# Patient Record
Sex: Female | Born: 1960 | Race: White | Hispanic: No | Marital: Married | Smoking: Current every day smoker
Health system: Southern US, Community
[De-identification: ages and names within clinical notes are randomized; demographics above are authoritative.]

## PROBLEM LIST (undated history)

## (undated) DIAGNOSIS — G4730 Sleep apnea, unspecified: Secondary | ICD-10-CM

## (undated) DIAGNOSIS — K219 Gastro-esophageal reflux disease without esophagitis: Secondary | ICD-10-CM

## (undated) DIAGNOSIS — M722 Plantar fascial fibromatosis: Secondary | ICD-10-CM

## (undated) DIAGNOSIS — K648 Other hemorrhoids: Secondary | ICD-10-CM

## (undated) DIAGNOSIS — Z973 Presence of spectacles and contact lenses: Secondary | ICD-10-CM

## (undated) DIAGNOSIS — Z720 Tobacco use: Secondary | ICD-10-CM

## (undated) DIAGNOSIS — F419 Anxiety disorder, unspecified: Secondary | ICD-10-CM

## (undated) DIAGNOSIS — I700 Atherosclerosis of aorta: Secondary | ICD-10-CM

## (undated) DIAGNOSIS — R945 Abnormal results of liver function studies: Secondary | ICD-10-CM

## (undated) DIAGNOSIS — I10 Essential (primary) hypertension: Secondary | ICD-10-CM

## (undated) DIAGNOSIS — K760 Fatty (change of) liver, not elsewhere classified: Secondary | ICD-10-CM

## (undated) DIAGNOSIS — E7800 Pure hypercholesterolemia, unspecified: Secondary | ICD-10-CM

## (undated) DIAGNOSIS — D126 Benign neoplasm of colon, unspecified: Secondary | ICD-10-CM

## (undated) DIAGNOSIS — K5790 Diverticulosis of intestine, part unspecified, without perforation or abscess without bleeding: Secondary | ICD-10-CM

## (undated) DIAGNOSIS — R251 Tremor, unspecified: Secondary | ICD-10-CM

## (undated) DIAGNOSIS — Z8719 Personal history of other diseases of the digestive system: Secondary | ICD-10-CM

## (undated) DIAGNOSIS — R7989 Other specified abnormal findings of blood chemistry: Secondary | ICD-10-CM

## (undated) HISTORY — DX: Essential (primary) hypertension: I10

## (undated) HISTORY — DX: Tremor, unspecified: R25.1

## (undated) HISTORY — DX: Tobacco use: Z72.0

## (undated) HISTORY — DX: Abnormal results of liver function studies: R94.5

## (undated) HISTORY — DX: Benign neoplasm of colon, unspecified: D12.6

## (undated) HISTORY — DX: Pure hypercholesterolemia, unspecified: E78.00

## (undated) HISTORY — DX: Other hemorrhoids: K64.8

## (undated) HISTORY — DX: Fatty (change of) liver, not elsewhere classified: K76.0

## (undated) HISTORY — DX: Atherosclerosis of aorta: I70.0

## (undated) HISTORY — DX: Anxiety disorder, unspecified: F41.9

## (undated) HISTORY — DX: Diverticulosis of intestine, part unspecified, without perforation or abscess without bleeding: K57.90

## (undated) HISTORY — DX: Other specified abnormal findings of blood chemistry: R79.89

## (undated) HISTORY — DX: Plantar fascial fibromatosis: M72.2

---
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
## 1993-05-05 HISTORY — PX: TUBAL LIGATION: SHX77

## 1998-03-06 ENCOUNTER — Other Ambulatory Visit: Admission: RE | Admit: 1998-03-06 | Discharge: 1998-03-06 | Payer: Self-pay | Admitting: *Deleted

## 1998-03-16 ENCOUNTER — Other Ambulatory Visit: Admission: RE | Admit: 1998-03-16 | Discharge: 1998-03-16 | Payer: Self-pay | Admitting: *Deleted

## 2001-07-09 ENCOUNTER — Encounter: Payer: Self-pay | Admitting: Family Medicine

## 2001-07-09 ENCOUNTER — Encounter: Admission: RE | Admit: 2001-07-09 | Discharge: 2001-07-09 | Payer: Self-pay | Admitting: Family Medicine

## 2001-08-05 ENCOUNTER — Encounter: Payer: Self-pay | Admitting: Family Medicine

## 2001-08-05 LAB — CONVERTED CEMR LAB: TSH: 1.86 microintl units/mL

## 2001-08-19 ENCOUNTER — Other Ambulatory Visit: Admission: RE | Admit: 2001-08-19 | Discharge: 2001-08-19 | Payer: Self-pay | Admitting: Family Medicine

## 2001-08-19 ENCOUNTER — Encounter: Payer: Self-pay | Admitting: Family Medicine

## 2001-08-19 LAB — CONVERTED CEMR LAB: Pap Smear: NORMAL

## 2002-11-01 ENCOUNTER — Encounter: Payer: Self-pay | Admitting: Family Medicine

## 2002-11-01 LAB — CONVERTED CEMR LAB
Blood Glucose, Fasting: 94 mg/dL
TSH: 1.59 microintl units/mL

## 2002-11-11 ENCOUNTER — Encounter: Payer: Self-pay | Admitting: Family Medicine

## 2002-11-11 ENCOUNTER — Other Ambulatory Visit: Admission: RE | Admit: 2002-11-11 | Discharge: 2002-11-11 | Payer: Self-pay | Admitting: Family Medicine

## 2002-11-11 LAB — CONVERTED CEMR LAB: Pap Smear: NORMAL

## 2002-11-15 ENCOUNTER — Encounter: Payer: Self-pay | Admitting: Family Medicine

## 2002-11-15 ENCOUNTER — Ambulatory Visit (HOSPITAL_COMMUNITY): Admission: RE | Admit: 2002-11-15 | Discharge: 2002-11-15 | Payer: Self-pay | Admitting: Family Medicine

## 2004-03-21 ENCOUNTER — Ambulatory Visit: Payer: Self-pay | Admitting: Family Medicine

## 2004-03-21 LAB — CONVERTED CEMR LAB: TSH: 2.39 microintl units/mL

## 2004-05-27 ENCOUNTER — Ambulatory Visit: Payer: Self-pay | Admitting: Family Medicine

## 2004-05-27 LAB — CONVERTED CEMR LAB: Blood Glucose, Fasting: 94 mg/dL

## 2004-05-31 ENCOUNTER — Ambulatory Visit: Payer: Self-pay | Admitting: Family Medicine

## 2004-05-31 ENCOUNTER — Other Ambulatory Visit: Admission: RE | Admit: 2004-05-31 | Discharge: 2004-05-31 | Payer: Self-pay | Admitting: Family Medicine

## 2004-06-06 ENCOUNTER — Ambulatory Visit (HOSPITAL_COMMUNITY): Admission: RE | Admit: 2004-06-06 | Discharge: 2004-06-06 | Payer: Self-pay | Admitting: Family Medicine

## 2004-07-23 ENCOUNTER — Ambulatory Visit: Payer: Self-pay | Admitting: Internal Medicine

## 2005-03-25 ENCOUNTER — Ambulatory Visit: Payer: Self-pay | Admitting: Family Medicine

## 2005-04-14 ENCOUNTER — Ambulatory Visit: Payer: Self-pay | Admitting: Family Medicine

## 2005-05-14 ENCOUNTER — Ambulatory Visit: Payer: Self-pay | Admitting: Family Medicine

## 2005-06-02 ENCOUNTER — Ambulatory Visit: Payer: Self-pay | Admitting: Family Medicine

## 2005-06-02 LAB — CONVERTED CEMR LAB: WBC, blood: 13.3 10*3/uL

## 2005-06-06 ENCOUNTER — Encounter: Payer: Self-pay | Admitting: Family Medicine

## 2005-06-06 ENCOUNTER — Ambulatory Visit: Payer: Self-pay | Admitting: Family Medicine

## 2005-06-06 ENCOUNTER — Other Ambulatory Visit: Admission: RE | Admit: 2005-06-06 | Discharge: 2005-06-06 | Payer: Self-pay | Admitting: Family Medicine

## 2006-11-04 ENCOUNTER — Ambulatory Visit: Payer: Self-pay | Admitting: Family Medicine

## 2007-03-08 ENCOUNTER — Ambulatory Visit: Payer: Self-pay | Admitting: Family Medicine

## 2007-03-08 DIAGNOSIS — F411 Generalized anxiety disorder: Secondary | ICD-10-CM | POA: Insufficient documentation

## 2007-03-08 DIAGNOSIS — I10 Essential (primary) hypertension: Secondary | ICD-10-CM | POA: Insufficient documentation

## 2007-06-14 ENCOUNTER — Telehealth: Payer: Self-pay | Admitting: Family Medicine

## 2007-06-18 ENCOUNTER — Encounter: Payer: Self-pay | Admitting: Family Medicine

## 2007-06-18 DIAGNOSIS — E7800 Pure hypercholesterolemia, unspecified: Secondary | ICD-10-CM | POA: Insufficient documentation

## 2007-06-21 ENCOUNTER — Ambulatory Visit: Payer: Self-pay | Admitting: Family Medicine

## 2007-06-21 LAB — CONVERTED CEMR LAB
Albumin: 3.7 g/dL (ref 3.5–5.2)
GFR calc non Af Amer: 83 mL/min
HDL: 43.7 mg/dL (ref >39.0)
LDL Cholesterol: 103 mg/dL — ABNORMAL HIGH (ref 0–99)
Potassium: 4.1 meq/L (ref 3.5–5.1)
Sodium: 142 meq/L (ref 135–145)
TSH: 1.84 microintl units/mL (ref 0.35–5.50)
Total CHOL/HDL Ratio: 3.7
Triglycerides: 66 mg/dL (ref 0–149)
VLDL: 13 mg/dL (ref 0–40)

## 2007-06-24 ENCOUNTER — Other Ambulatory Visit: Admission: RE | Admit: 2007-06-24 | Discharge: 2007-06-24 | Payer: Self-pay | Admitting: Family Medicine

## 2007-06-24 ENCOUNTER — Ambulatory Visit: Payer: Self-pay | Admitting: Family Medicine

## 2007-06-24 ENCOUNTER — Encounter: Payer: Self-pay | Admitting: Family Medicine

## 2007-06-24 DIAGNOSIS — M722 Plantar fascial fibromatosis: Secondary | ICD-10-CM | POA: Insufficient documentation

## 2007-06-30 ENCOUNTER — Encounter (INDEPENDENT_AMBULATORY_CARE_PROVIDER_SITE_OTHER): Payer: Self-pay | Admitting: *Deleted

## 2007-07-16 ENCOUNTER — Ambulatory Visit (HOSPITAL_COMMUNITY): Admission: RE | Admit: 2007-07-16 | Discharge: 2007-07-16 | Payer: Self-pay | Admitting: Family Medicine

## 2007-07-26 ENCOUNTER — Encounter (INDEPENDENT_AMBULATORY_CARE_PROVIDER_SITE_OTHER): Payer: Self-pay | Admitting: *Deleted

## 2007-09-21 ENCOUNTER — Ambulatory Visit: Payer: Self-pay | Admitting: Family Medicine

## 2007-10-04 ENCOUNTER — Telehealth: Payer: Self-pay | Admitting: Family Medicine

## 2008-06-20 ENCOUNTER — Telehealth: Payer: Self-pay | Admitting: Family Medicine

## 2008-09-11 ENCOUNTER — Ambulatory Visit: Payer: Self-pay | Admitting: Family Medicine

## 2008-09-11 LAB — CONVERTED CEMR LAB
AST: 22 units/L (ref 0–37)
BUN: 13 mg/dL (ref 6–23)
Basophils Absolute: 0.1 10*3/uL (ref 0.0–0.1)
CO2: 30 meq/L (ref 19–32)
Calcium: 8.7 mg/dL (ref 8.4–10.5)
Cholesterol: 175 mg/dL (ref 0–200)
Eosinophils Absolute: 0.1 10*3/uL (ref 0.0–0.7)
GFR calc non Af Amer: 82.07 mL/min (ref >60)
Glucose, Bld: 99 mg/dL (ref 70–99)
HCT: 41.7 % (ref 36.0–46.0)
HDL: 46.1 mg/dL (ref >39.00)
Lymphs Abs: 2.9 10*3/uL (ref 0.7–4.0)
MCHC: 34.8 g/dL (ref 30.0–36.0)
Monocytes Relative: 5.3 % (ref 3.0–12.0)
Platelets: 205 10*3/uL (ref 150.0–400.0)
Potassium: 4.4 meq/L (ref 3.5–5.1)
RDW: 13.4 % (ref 11.5–14.6)
TSH: 1.77 microintl units/mL (ref 0.35–5.50)
Total Bilirubin: 0.5 mg/dL (ref 0.3–1.2)
VLDL: 14.2 mg/dL (ref 0.0–40.0)

## 2008-09-13 ENCOUNTER — Encounter: Payer: Self-pay | Admitting: Family Medicine

## 2008-09-13 ENCOUNTER — Other Ambulatory Visit: Admission: RE | Admit: 2008-09-13 | Discharge: 2008-09-13 | Payer: Self-pay | Admitting: Family Medicine

## 2008-09-13 ENCOUNTER — Ambulatory Visit: Payer: Self-pay | Admitting: Family Medicine

## 2008-09-13 LAB — CONVERTED CEMR LAB: Pap Smear: NORMAL

## 2008-09-18 ENCOUNTER — Encounter (INDEPENDENT_AMBULATORY_CARE_PROVIDER_SITE_OTHER): Payer: Self-pay | Admitting: *Deleted

## 2008-11-14 ENCOUNTER — Ambulatory Visit: Payer: Self-pay | Admitting: Family Medicine

## 2008-11-14 LAB — CONVERTED CEMR LAB
Basophils Relative: 0.2 % (ref 0.0–3.0)
Eosinophils Relative: 1.1 % (ref 0.0–5.0)
HCT: 37.9 % (ref 36.0–46.0)
Hemoglobin: 13.2 g/dL (ref 12.0–15.0)
Lymphocytes Relative: 28 % (ref 12.0–46.0)
Lymphs Abs: 2.9 10*3/uL (ref 0.7–4.0)
Monocytes Relative: 5.7 % (ref 3.0–12.0)
Neutro Abs: 6.7 10*3/uL (ref 1.4–7.7)
RBC: 4.4 M/uL (ref 3.87–5.11)
RDW: 13.4 % (ref 11.5–14.6)
WBC: 10.3 10*3/uL (ref 4.5–10.5)

## 2009-06-21 ENCOUNTER — Telehealth: Payer: Self-pay | Admitting: Family Medicine

## 2009-12-10 ENCOUNTER — Encounter (INDEPENDENT_AMBULATORY_CARE_PROVIDER_SITE_OTHER): Payer: Self-pay | Admitting: *Deleted

## 2010-01-11 ENCOUNTER — Telehealth (INDEPENDENT_AMBULATORY_CARE_PROVIDER_SITE_OTHER): Payer: Self-pay | Admitting: *Deleted

## 2010-01-14 ENCOUNTER — Ambulatory Visit: Payer: Self-pay | Admitting: Family Medicine

## 2010-01-14 LAB — CONVERTED CEMR LAB
ALT: 25 units/L (ref 0–35)
Albumin: 3.8 g/dL (ref 3.5–5.2)
BUN: 14 mg/dL (ref 6–23)
CO2: 28 meq/L (ref 19–32)
Calcium: 8.7 mg/dL (ref 8.4–10.5)
Chloride: 103 meq/L (ref 96–112)
Creatinine, Ser: 0.9 mg/dL (ref 0.4–1.2)
Glucose, Bld: 94 mg/dL (ref 70–99)
HDL: 45.1 mg/dL (ref >39.00)
Total CHOL/HDL Ratio: 4
Total Protein: 6.2 g/dL (ref 6.0–8.3)
Triglycerides: 68 mg/dL (ref 0.0–149.0)

## 2010-01-15 ENCOUNTER — Ambulatory Visit: Payer: Self-pay | Admitting: Family Medicine

## 2010-01-15 ENCOUNTER — Other Ambulatory Visit: Admission: RE | Admit: 2010-01-15 | Discharge: 2010-01-15 | Payer: Self-pay | Admitting: Family Medicine

## 2010-01-22 ENCOUNTER — Encounter: Payer: Self-pay | Admitting: Family Medicine

## 2010-01-22 LAB — CONVERTED CEMR LAB: Pap Smear: NEGATIVE

## 2010-05-25 ENCOUNTER — Encounter: Payer: Self-pay | Admitting: Family Medicine

## 2010-05-26 ENCOUNTER — Encounter: Payer: Self-pay | Admitting: Family Medicine

## 2010-06-04 NOTE — Progress Notes (Signed)
----   Converted from flag ---- ---- 01/10/2010 5:44 PM, Crawford Givens MD wrote: cmet and lipid 401.1  ---- 01/10/2010 10:56 AM, Liane Comber CMA (AAMA) wrote: Lab orders please! Hello! This pt is scheduled for cpx labs next week, which labs to draw and dx codes to use? Thanks Tasha ------------------------------

## 2010-06-04 NOTE — Letter (Signed)
Summary: Results Follow up Letter  [place] [facility]  [address] [place], [place] [address]   Phone: [phone]  Fax: [phone]    01/22/2010 MRN: [id number]    JAZIYA OBARR [address] [place], [place]  [address]    Dear Ms. FERRALL,  The following are the results of your recent test(s):  Test         Result    Pap Smear:        Normal ___X__  Not Normal _____ Comments:   ______________________________________________________ Cholesterol: LDL(Bad cholesterol):         Your goal is less than:         HDL (Good cholesterol):       Your goal is more than: Comments:  ______________________________________________________ Mammogram:        Normal _____  Not Normal _____ Comments:  ___________________________________________________________________ Hemoccult:        Normal _____  Not normal _______ Comments:    _____________________________________________________________________ Other Tests:    We routinely do not discuss normal results over the telephone.  If you desire a copy of the results, or you have any questions about this information we can discuss them at your next office visit.   Sincerely,    Dwana Curd. Para March, M.D.  [facility]

## 2010-06-04 NOTE — Assessment & Plan Note (Signed)
Summary: cpx/alc transfer from [facility]   Vital Signs:  Patient profile:   50 year old female LMP:     12/25/2009 Height:      67 inches Weight:      206.25 pounds BMI:     32.42 Temp:     98.7 degrees F oral Pulse rate:   88 / minute Pulse rhythm:   regular BP sitting:   132 / 70  (left arm) Cuff size:   regular  Vitals Entered By: Delilah Shan CMA [facility] Dull) (January 15, 2010 11:14 AM) CC: CPX.   Transfer from [facility], Preventive Care LMP (date): 12/25/2009     Enter LMP: 12/25/2009 Last PAP Result Normal, Satisfactory   History of Present Illness: CPE: See prev med.   Anxiety- controlled on meds.  Some fatigue noted but overall patient is much better than before.  No SI/HI.   Hypertension:      Using medication without problems or lightheadedness: yes Chest pain with exertion:no Edema:no Short of breath:no Average home BPs:not checked Other issues: no   Allergies: No Known Drug Allergies  Past History:  Family History: Last updated: 10-12-08 Father:DECEASED AT 66 MI  Mother: DECEASED AT 81 MI,CAD, DM BROTHER A 54 DIABETES AND RENAL CANCER (S/P Nephrectomy) BROTHER A 52 BROTHERS TWINS A 37 CV: +FATHER; +MOTHER HBP: +SELF  DM: +MOTHER, +BROTHER GOUT/ARTHRITIS: PROSTATE CANCER: BREAST/OVARIAN/UTERINE CANCER:" NEGATIVE COLON CANCER: DEPRESSION: NEGATIVE ETOH/DRUG ABUSE: NEGATIVE OTHER: NEGATIVE STROKE  Social History: Last updated: 01/15/2010 Marital Status: Married since 38 LIVES WITH HUSBAND Children: NONE 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 BTL AFTER PREGNANCY WITH BLIGHTED OVUM  1995  Family History: Reviewed history from 10/12/2008 and no changes required. Father:DECEASED AT 30 MI  Mother: DECEASED AT 10  MI,CAD, DM BROTHER A 54 DIABETES AND RENAL CANCER (S/P Nephrectomy) BROTHER A 52 BROTHERS TWINS A 37 CV: +FATHER; +MOTHER HBP: +SELF  DM: +MOTHER, +BROTHER GOUT/ARTHRITIS: PROSTATE CANCER: BREAST/OVARIAN/UTERINE CANCER:" NEGATIVE COLON CANCER: DEPRESSION: NEGATIVE ETOH/DRUG ABUSE: NEGATIVE OTHER: NEGATIVE STROKE  Social History: Reviewed history from 10-12-08 and no changes required. 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  Otherwise negative.    Physical Exam  General:  GEN: nad, alert and oriented HEENT: mucous membranes moist NECK: supple w/o LA CV: rrr.  no murmur PULM: ctab, no inc wob ABD: soft, +bs EXT: no edema SKIN: no acute rash  chaperoned exam  for breast/pelvic Breasts:  No mass, nodules, thickening, tenderness, bulging, retraction, inflamation, nipple discharge or skin changes noted.   Genitalia:  Normal introitus for age, no external lesions, no vaginal discharge, mucosa pink and moist, no vaginal or cervical lesions, no vaginal atrophy, no friaility or hemorrhage, normal uterus size and position, no adnexal masses or tenderness   Impression & Recommendations:  Problem # 1:  HEALTH MAINTENANCE EXAM (ICD-V70.0) Pap collected.  See prev med.   Problem # 2:  ESSENTIAL HYPERTENSION, BENIGN (ICD-401.1) No change in meds.  d/w patient [id number] diet and exericse.  Needs to lose weight.  Her updated medication list for this problem includes:    Metoprolol Tartrate 50 Mg Tabs (  Metoprolol tartrate) .Marland Kitchen... 1/2 tablet twice a day by mouth  Problem # 3:  ANXIETY (ICD-300.00) No change in meds.  D/w patient to cut down on alcohol.  Her updated medication list for this problem includes:    Sertraline Hcl 100 Mg Tabs (Sertraline hcl) .Marland Kitchen... 1/2 tab once daily  Problem # 4:   NICOTINE ADDICTION (ICD-305.1) d/w patient ZO:XWRUEAV, not yet ready to quit.   Complete Medication List: 1)  Sertraline Hcl 100 Mg Tabs (Sertraline hcl) .... 1/2 tab once daily 2)  Metoprolol Tartrate 50 Mg Tabs (Metoprolol tartrate) .... 1/2 tablet twice a day by mouth  Other Orders: Radiology Referral (Radiology)  PAP Screening:    Last PAP smear:  09/13/2008    Reviewed PAP smear recommendations:  Ordered  Mammogram Screening:    Last Mammogram:  07/16/2007    Reviewed Mammogram recommendations:  mammogram ordered  Osteoporosis Risk Assessment:  Risk Factors for Fracture or Low Bone Density:   Race (White or Asian):     yes   Smoking status:       current  Immunization & Chemoprophylaxis:    Tetanus vaccine: Td  (08/03/2001)  Patient Instructions: 1)  See Shirlee Limerick about your referral before your leave today.  You can get your results of your pap smear through our phone system.  Follow the instructions on the blue card.  2)  Work on quitting smoking and exercising more.    3)  Please follow up as needed  Prescriptions: METOPROLOL TARTRATE 50 MG TABS (METOPROLOL TARTRATE) 1/2 tablet twice a day by mouth  #45 x 3   Entered and Authorized by:   Crawford Givens MD   Signed by:   Crawford Givens MD on 01/15/2010   Method used:   [facility]        [facility]  [address] [phone]* (retail)       [address]       [place], [place]  [address]       Ph: [phone] or [phone]       Fax: [phone]   RxID:   [id number] SERTRALINE HCL 100 MG TABS (SERTRALINE HCL) 1/2 tab once daily  #45 x 3   Entered and Authorized by:   Crawford Givens MD   Signed by:   Crawford Givens MD on 01/15/2010   Method used:   [facility]        [facility]  [address] [phone]* (retail)       [address]       [place], [place]  [address]       Ph: [phone] or [phone]       Fax: [phone]   RxID:   [id number]   Current Allergies (reviewed  today): No known allergies      Prevention & Chronic Care Immunizations   Influenza vaccine: Not documented    Tetanus booster: 08/03/2001: Td    Pneumococcal vaccine: Not documented   Pneumococcal vaccine due: 09/04/2027  Other Screening   Pap smear: Normal, Satisfactory  (09/13/2008)   Pap smear action/deferral: Ordered  (01/15/2010)   Pap smear due: 09/2009    Mammogram: Normal Bilateral  (07/16/2007)   Mammogram action/deferral: mammogram ordered  (01/15/2010)   Mammogram due: 07/2008   Smoking status: current  (09/13/2008)   Smoking cessation counseling: yes  (09/13/2008)  Lipids   Total Cholesterol: 181  (01/14/2010)   LDL:  122  (01/14/2010)   LDL Direct: Not documented   HDL: 45.10  (01/14/2010)   Triglycerides: 68.0  (01/14/2010)    SGOT (AST): 22  (01/14/2010)   SGPT (ALT): 25  (01/14/2010)   Alkaline phosphatase: 86  (01/14/2010)   Total bilirubin: 0.7  (01/14/2010)    Lipid flowsheet reviewed?: Yes  Hypertension   Last Blood Pressure: 132 / 70  (01/15/2010)   Serum creatinine: 0.9  (01/14/2010)   Serum potassium 4.4  (01/14/2010)    Hypertension flowsheet reviewed?: Yes  Self-Management Support :    Hypertension self-management support: Not documented    Lipid self-management support: Not documented    Nursing Instructions: Give Flu vaccine today Pap smear today Schedule screening mammogram (see order)

## 2010-06-04 NOTE — Progress Notes (Signed)
Summary: Rx Sertraline  Phone Note Refill Request Call back at [phone] Message [facility]:  [facility] [address] on June 21, 2009 4:55 PM  Refills Requested: Medication #1:  SERTRALINE HCL 100 MG TABS 1/2 tab once daily No LR date sent. Received e-scribe refill request   Method Requested: Electronic Initial call taken by: Sydell Axon LPN,  June 21, 2009 4:55 PM    Prescriptions: SERTRALINE HCL 100 MG TABS (SERTRALINE HCL) 1/2 tab once daily  #15 Tablet x 10   Entered and Authorized by:   Shaune Leeks MD   Signed by:   Shaune Leeks MD on 06/21/2009   Method used:   [facility]        [facility]  [address] [phone][facility] ([facility]ail)       [address]       [place], [place]  [address]       Ph: [phone] or [phone]       Fax: [phone]   RxID:   [id number]

## 2010-06-04 NOTE — Letter (Signed)
Summary: [name] letter  [place] at [facility]  [address] [place], [place] [address]   Phone: [phone]  Fax: [phone]       12/10/2009 MRN: [id number]  Nichole Burke [address] [place], [place]  [address]  Dear Ms. [name] [facility] [place], and [facility] announce the retirement of Arta Silence, M.D., from full-time practice at the [facility] office effective November 01, 2009 and his plans of returning part-time.  It is important to Dr. Hetty Ely and to our practice that you understand that [facility] has seven physicians in our office for your health care needs.  We will continue to offer the same exceptional care that you have today.    Dr. Hetty Ely has spoken to many of you about his plans for retirement and returning part-time in the fall.   We will continue to work with you through the transition to schedule appointments for you in the office and meet the high standards that  is committed to.   Again, it is with great pleasure that we share the news that Dr. Hetty Ely will return to [facility] at [facility] in October of 2011 with a reduced schedule.    If you have any questions, or would like to request an appointment with one of our physicians, please call us at [phone] and press the option for Scheduling an appointment.  We take pleasure in providing you with excellent patient care and look forward to seeing you at your next office visit.  Our [facility] Physicians are:  Tillman Abide, M.D. Laurita Quint, M.D. Roxy Manns, M.D. Kerby Nora, M.D. Hannah Beat, M.D. Ruthe Mannan, M.D. We proudly welcomed Raechel Ache, M.D. and Eustaquio Boyden, M.D. to the practice in July/August 2011.  Sincerely,   [facility]

## 2010-10-09 ENCOUNTER — Encounter: Payer: Self-pay | Admitting: Family Medicine

## 2010-10-10 ENCOUNTER — Encounter: Payer: Self-pay | Admitting: Family Medicine

## 2010-10-10 ENCOUNTER — Ambulatory Visit (INDEPENDENT_AMBULATORY_CARE_PROVIDER_SITE_OTHER): Payer: PRIVATE HEALTH INSURANCE | Admitting: Family Medicine

## 2010-10-10 DIAGNOSIS — F17200 Nicotine dependence, unspecified, uncomplicated: Secondary | ICD-10-CM

## 2010-10-10 DIAGNOSIS — K7689 Other specified diseases of liver: Secondary | ICD-10-CM

## 2010-10-10 DIAGNOSIS — K760 Fatty (change of) liver, not elsewhere classified: Secondary | ICD-10-CM

## 2010-10-10 DIAGNOSIS — R079 Chest pain, unspecified: Secondary | ICD-10-CM

## 2010-10-10 DIAGNOSIS — I10 Essential (primary) hypertension: Secondary | ICD-10-CM

## 2010-10-10 DIAGNOSIS — M25569 Pain in unspecified knee: Secondary | ICD-10-CM

## 2010-10-10 DIAGNOSIS — I779 Disorder of arteries and arterioles, unspecified: Secondary | ICD-10-CM

## 2010-10-10 NOTE — Patient Instructions (Signed)
See Shirlee Limerick about your referral before your leave today. I would get flavored nicotine gum and use it to replace the cigarettes. Cut down on drinking. I would take 1 aleve twice a day as needed with food for knee pain.  Work on losing weight.   Take care.

## 2010-10-10 NOTE — Progress Notes (Signed)
Hospital follow up.  Woke up Sunday night with atypical chest and back pain.  Pain radiated up into her neck.  She took an aspirin, but it didn't help.  EMS to ER and admitted for r/o MI.  No PE on CT angio, echo wnl.  Mild inc in LFTs, fatty liver noted on CT scan.  Back pain continued in hospital and she had trouble laying back to the pain.  Pain has been gone since Tuesday.  CP free now, not sob.  Smoking ~1ppd, drinking about a 5th a week.  Mild dilation of ascending aorta noted on CT chest.  FH of cad.   PMH and SH reviewed  ROS: See HPI, otherwise noncontributory.  Meds, vitals, and allergies reviewed.   GEN: nad, alert and oriented, overweight HEENT: mucous membranes moist NECK: supple w/o LA CV: rrr. PULM: ctab, no inc wob ABD: soft, +bs EXT: no edema SKIN: no acute rash, lipoma noted on R upper back

## 2010-10-11 ENCOUNTER — Encounter: Payer: Self-pay | Admitting: Family Medicine

## 2010-10-11 DIAGNOSIS — R079 Chest pain, unspecified: Secondary | ICD-10-CM | POA: Insufficient documentation

## 2010-10-11 NOTE — Assessment & Plan Note (Signed)
Atypical but with +FH, HTN, smoker.  Refer to cards for consideration of stress testing.  App cards input.  This could have been due to anxiety, MSK source.  No PE seen on angio.

## 2010-10-11 NOTE — Assessment & Plan Note (Signed)
No change in meds for now

## 2010-10-11 NOTE — Assessment & Plan Note (Signed)
D/w pt. Needs to lose weight and cut down etoh.

## 2010-10-11 NOTE — Assessment & Plan Note (Signed)
D/w pt about getting replacement, gum and using this.

## 2010-10-11 NOTE — Assessment & Plan Note (Signed)
Will refer to cards about this and the atypical chest pain.

## 2010-10-28 ENCOUNTER — Ambulatory Visit (INDEPENDENT_AMBULATORY_CARE_PROVIDER_SITE_OTHER): Payer: PRIVATE HEALTH INSURANCE | Admitting: Cardiology

## 2010-10-28 ENCOUNTER — Encounter: Payer: Self-pay | Admitting: Cardiology

## 2010-10-28 DIAGNOSIS — R079 Chest pain, unspecified: Secondary | ICD-10-CM

## 2010-10-28 DIAGNOSIS — F17200 Nicotine dependence, unspecified, uncomplicated: Secondary | ICD-10-CM

## 2010-10-28 DIAGNOSIS — I779 Disorder of arteries and arterioles, unspecified: Secondary | ICD-10-CM

## 2010-10-28 DIAGNOSIS — I10 Essential (primary) hypertension: Secondary | ICD-10-CM

## 2010-10-28 DIAGNOSIS — I77810 Thoracic aortic ectasia: Secondary | ICD-10-CM

## 2010-10-28 MED ORDER — METOPROLOL SUCCINATE ER 25 MG PO TB24: 25.0000 mg | ORAL_TABLET | Freq: Every day | ORAL | Status: DC

## 2010-10-28 NOTE — Assessment & Plan Note (Signed)
Mildly dilated ascending thoracic aorta to 3.7 cm.  Probably most effective way to prevent progression will be to keep BP under control.  I will get an MR angiogram in 1 year to assess for any progressive dilatation.

## 2010-10-28 NOTE — Assessment & Plan Note (Signed)
She is currently taking short-acting metoprolol once a day. A beta blocker is a reasonable choice given her aortic dilatation.  I will change her beta blocker to Toprol XL 25 mg daily.  She will check her BP daily at home and I will see what her pressures are running when she comes back to the office in 2 weeks.

## 2010-10-28 NOTE — Patient Instructions (Signed)
Your physician recommends that you schedule a follow-up appointment in: 3 WEEKS WITH DR [facility]  Your physician has recommended you make the following change in your medication: STOP METOPROLOL AND START TOPROL XL 25 MG EVERY DAY   ASPIRIN 81 MG EVERY DAY Your physician has requested that you have en exercise stress myoview. For further information please visit [email or web address] Please follow instruction sheet, as given.  MRA OF CHEST  June 2013

## 2010-10-28 NOTE — Progress Notes (Signed)
PCP: Dr. Para March  50 yo with history of HTN and smoking presents for cardiology evaluation after recent chest pain admission at [facility].  Patient went to the ER in [facility] in 6/12 with atypical chest pain.  She woke up at night with chest/neck/upper back pain that was severe.  It did not subside after about an hour so she went to the ER.  She ruled out for MI.  She had a CT chest that did not show a PE but did show a 3.7 cm dilated ascending thoracic aorta.  She was told that the pain was likely musculoskeletal, and she was sent home. Since then, she has had no further chest pain. No history of exertional chest pain.  She is short of breath climbing steps or walking up hills.  She does have a family history of CAD: father with MI at 23 and mother with MI at 12.  She is currently taking metoprolol tartrate 25 mg once daily for BP.   ECG: NSR, normal  Labs (9/11): LDL 122  PMH: 1. Active smoker 2. HTN 3. Fatty liver with mildly increased LFTs.  Suspect may be from ETOH.  4. Dilation ascending aorta: 3.7 cm on chest CT in 6/12.  5. Atypical chest pain.  6. 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   FH: Father with MI at 15, Mother with MI at 63  ROS: All systems reviewed and negative except as per HPI.   Current Outpatient Prescriptions  Medication Sig Dispense Refill  . aspirin 81 MG tablet Take 81 mg by mouth daily.        . naproxen sodium (ANAPROX) 220 MG tablet Take 220 mg by mouth 2 (two) times daily with a meal.        . sertraline (ZOLOFT) 100 MG tablet Take 50 mg by mouth as needed.       Marland Kitchen DISCONTD: metoprolol (LOPRESSOR) 50 MG tablet Take 25 mg by mouth daily.       . metoprolol succinate (TOPROL XL) 25 MG 24 hr tablet Take 1 tablet (25 mg total) by mouth daily.  30 tablet  11  . DISCONTD: traMADol (ULTRAM) 50 MG tablet Take 25 mg by mouth every 4 (four) hours as needed.          BP  143/92  Pulse 72  Resp 16  Ht 5\' 6"  (1.676 m)  Wt 214 lb (97.07 kg)  BMI 34.54 kg/m2  LMP 09/19/2010 General: NAD, overweight Neck: No JVD, no thyromegaly or thyroid nodule.  Lungs: Clear to auscultation bilaterally with normal respiratory effort. CV: Nondisplaced PMI.  Heart regular S1/S2, no S3/S4, no murmur.  No peripheral edema.  No carotid bruit.  Normal pedal pulses.  Abdomen: Soft, nontender, no hepatosplenomegaly, no distention.  Neurologic: Alert and oriented x 3.  Psych: Normal affect. Extremities: No clubbing or cyanosis.  HEENT: Normal. Skin: no lesions/rashes.

## 2010-10-28 NOTE — Assessment & Plan Note (Signed)
Atypical chest pain with overnight admission recently to [facility].  She ruled out for MI.  CTA chest negative for PE.  She has multiple cardiac risk factors: family history of premature CAD, smoking, and HTN.  I will have her start ASA 81 mg daily. I will get an ETT-myoview to assess for ischemia.

## 2010-10-28 NOTE — Assessment & Plan Note (Signed)
Patient continues to smoke.  I counselled her to quit.  She will try nicotine patches.

## 2010-11-01 ENCOUNTER — Other Ambulatory Visit: Payer: Self-pay | Admitting: *Deleted

## 2010-11-01 DIAGNOSIS — R079 Chest pain, unspecified: Secondary | ICD-10-CM

## 2010-11-04 ENCOUNTER — Encounter: Payer: Self-pay | Admitting: *Deleted

## 2010-11-05 ENCOUNTER — Ambulatory Visit (HOSPITAL_BASED_OUTPATIENT_CLINIC_OR_DEPARTMENT_OTHER): Payer: Managed Care, Other (non HMO) | Admitting: Radiology

## 2010-11-05 ENCOUNTER — Ambulatory Visit (HOSPITAL_COMMUNITY): Payer: Managed Care, Other (non HMO) | Attending: Cardiology | Admitting: Radiology

## 2010-11-05 ENCOUNTER — Encounter (HOSPITAL_COMMUNITY): Payer: PRIVATE HEALTH INSURANCE | Admitting: Radiology

## 2010-11-05 DIAGNOSIS — I10 Essential (primary) hypertension: Secondary | ICD-10-CM | POA: Insufficient documentation

## 2010-11-05 DIAGNOSIS — R072 Precordial pain: Secondary | ICD-10-CM

## 2010-11-05 DIAGNOSIS — R079 Chest pain, unspecified: Secondary | ICD-10-CM

## 2010-11-05 DIAGNOSIS — R0989 Other specified symptoms and signs involving the circulatory and respiratory systems: Secondary | ICD-10-CM

## 2010-11-05 DIAGNOSIS — E785 Hyperlipidemia, unspecified: Secondary | ICD-10-CM | POA: Insufficient documentation

## 2010-11-05 DIAGNOSIS — R0609 Other forms of dyspnea: Secondary | ICD-10-CM | POA: Insufficient documentation

## 2010-11-08 NOTE — Progress Notes (Signed)
Left message for pt that she is to follow up in 1 year after her angiogram and will be contacted at a later date to schedule.  Requested she call back with any questions

## 2010-11-28 ENCOUNTER — Ambulatory Visit: Payer: PRIVATE HEALTH INSURANCE | Admitting: Cardiology

## 2011-02-02 ENCOUNTER — Other Ambulatory Visit: Payer: Self-pay | Admitting: Family Medicine

## 2011-08-06 ENCOUNTER — Other Ambulatory Visit: Payer: Self-pay | Admitting: Family Medicine

## 2011-08-06 NOTE — Telephone Encounter (Signed)
Received refill electronically from pharmacy. Patient was last seen 06/12. Is it okay to refill medication?

## 2011-08-07 NOTE — Telephone Encounter (Signed)
Needs 30 min OV.  

## 2011-08-07 NOTE — Telephone Encounter (Signed)
LMOVM of home phone to scheduled 30 min OV.

## 2011-10-14 ENCOUNTER — Other Ambulatory Visit: Payer: Self-pay | Admitting: Cardiology

## 2011-10-14 ENCOUNTER — Other Ambulatory Visit: Payer: Self-pay | Admitting: *Deleted

## 2011-10-14 ENCOUNTER — Telehealth: Payer: Self-pay | Admitting: Family Medicine

## 2011-10-14 MED ORDER — SERTRALINE HCL 100 MG PO TABS: 50.0000 mg | ORAL_TABLET | Freq: Every day | ORAL | Status: DC

## 2011-10-14 MED ORDER — METOPROLOL SUCCINATE ER 25 MG PO TB24: 25.0000 mg | ORAL_TABLET | Freq: Every day | ORAL | Status: DC

## 2011-10-14 NOTE — Telephone Encounter (Signed)
Pt is needing refilled on Zoloft [facility] [place] I set her up a CPE is August already.

## 2011-10-14 NOTE — Telephone Encounter (Signed)
Sent!

## 2011-10-28 ENCOUNTER — Other Ambulatory Visit: Payer: Self-pay | Admitting: *Deleted

## 2011-10-28 DIAGNOSIS — I779 Disorder of arteries and arterioles, unspecified: Secondary | ICD-10-CM

## 2011-10-29 ENCOUNTER — Other Ambulatory Visit: Payer: Self-pay | Admitting: *Deleted

## 2011-10-29 DIAGNOSIS — I779 Disorder of arteries and arterioles, unspecified: Secondary | ICD-10-CM

## 2011-10-30 ENCOUNTER — Inpatient Hospital Stay (HOSPITAL_COMMUNITY): Admission: RE | Admit: 2011-10-30 | Payer: PRIVATE HEALTH INSURANCE | Source: Ambulatory Visit

## 2011-11-12 ENCOUNTER — Other Ambulatory Visit: Payer: Self-pay | Admitting: Family Medicine

## 2011-11-12 NOTE — Telephone Encounter (Signed)
Sent!

## 2011-11-12 NOTE — Telephone Encounter (Signed)
Received refill electronically from pharmacy. Patient has an appointment scheduled 12/05/11. Is it okay to refill medication?

## 2011-11-23 ENCOUNTER — Other Ambulatory Visit: Payer: Self-pay | Admitting: Cardiology

## 2011-11-26 ENCOUNTER — Other Ambulatory Visit: Payer: Self-pay | Admitting: Family Medicine

## 2011-11-26 DIAGNOSIS — E7800 Pure hypercholesterolemia, unspecified: Secondary | ICD-10-CM

## 2011-12-01 ENCOUNTER — Other Ambulatory Visit (INDEPENDENT_AMBULATORY_CARE_PROVIDER_SITE_OTHER): Payer: PRIVATE HEALTH INSURANCE

## 2011-12-01 DIAGNOSIS — E7800 Pure hypercholesterolemia, unspecified: Secondary | ICD-10-CM

## 2011-12-01 LAB — COMPREHENSIVE METABOLIC PANEL
ALT: 33 U/L (ref 0–35)
AST: 28 U/L (ref 0–37)
Albumin: 4.1 g/dL (ref 3.5–5.2)
BUN: 15 mg/dL (ref 6–23)
CO2: 27 mEq/L (ref 19–32)
Calcium: 9.4 mg/dL (ref 8.4–10.5)
Chloride: 103 mEq/L (ref 96–112)
GFR: 57.87 mL/min — ABNORMAL LOW (ref >60.00)
Potassium: 4.5 mEq/L (ref 3.5–5.1)

## 2011-12-01 LAB — LIPID PANEL: Total CHOL/HDL Ratio: 4

## 2011-12-05 ENCOUNTER — Encounter: Payer: Self-pay | Admitting: Family Medicine

## 2011-12-05 ENCOUNTER — Ambulatory Visit (INDEPENDENT_AMBULATORY_CARE_PROVIDER_SITE_OTHER): Payer: PRIVATE HEALTH INSURANCE | Admitting: Family Medicine

## 2011-12-05 VITALS — BP 166/88 | HR 67 | Temp 98.6°F | Resp 20 | Ht 66.75 in | Wt 217.5 lb

## 2011-12-05 DIAGNOSIS — D179 Benign lipomatous neoplasm, unspecified: Secondary | ICD-10-CM

## 2011-12-05 DIAGNOSIS — I779 Disorder of arteries and arterioles, unspecified: Secondary | ICD-10-CM

## 2011-12-05 DIAGNOSIS — Z23 Encounter for immunization: Secondary | ICD-10-CM

## 2011-12-05 DIAGNOSIS — Z7289 Other problems related to lifestyle: Secondary | ICD-10-CM

## 2011-12-05 DIAGNOSIS — I10 Essential (primary) hypertension: Secondary | ICD-10-CM

## 2011-12-05 DIAGNOSIS — F411 Generalized anxiety disorder: Secondary | ICD-10-CM

## 2011-12-05 DIAGNOSIS — K7689 Other specified diseases of liver: Secondary | ICD-10-CM

## 2011-12-05 DIAGNOSIS — Z0000 Encounter for general adult medical examination without abnormal findings: Secondary | ICD-10-CM

## 2011-12-05 DIAGNOSIS — K760 Fatty (change of) liver, not elsewhere classified: Secondary | ICD-10-CM

## 2011-12-05 MED ORDER — SERTRALINE HCL 100 MG PO TABS: 50.0000 mg | ORAL_TABLET | Freq: Every day | ORAL | Status: DC

## 2011-12-05 MED ORDER — METOPROLOL SUCCINATE ER 25 MG PO TB24: ORAL_TABLET | ORAL | Status: DC

## 2011-12-05 NOTE — Patient Instructions (Addendum)
I would get a flu shot each fall.   Call about your mammogram.  Start back on the aspirin.  Call about the follow up appointment with cardiology.   Stop taking aleve.  Take tylenol instead.  Take prilosec 20mg  a day. Get it over the counter. Cut back on drinking.

## 2011-12-05 NOTE — Progress Notes (Signed)
CPE- See plan.  Routine anticipatory guidance given to patient.  See health maintenance. Pap smear.  No abnormals.  Not due for pap 2013.   Tetanus shot due.   Flu shot.  Encouraged.   Mammogram.  She's due, she'll call about this.   Hypertension:    Using medication without problems or lightheadedness: yes Chest pain with exertion:no Edema:no Short of breath: only due to deconditioning H/o dilated aorta and is due for f/u with cards.  SBP 130s on home checks per patient.  Anxiety.  Doing well on SSRI.  "feel better on it."  D/w pt about mood and etoh use.  No Si/Hi.   Stomach pain. On nsaids.  5th of bourbon a week. occ vomiting.  No blood in vomit.  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  Taking tums with some relief of stomach pain.  Smoking.   PMH and SH reviewed  Meds, vitals, and allergies reviewed.   ROS: See HPI.  Otherwise negative.    GEN: nad, alert and oriented HEENT: mucous membranes moist NECK: supple w/o LA CV: rrr. PULM: ctab, no inc wob ABD: soft, +bs EXT: no edema SKIN: no acute rash Breast exam: No mass, nodules, thickening, tenderness, bulging, retraction, inflamation, nipple discharge or skin changes noted.  No axillary or clavicular LA.  Chaperoned exam.

## 2011-12-07 DIAGNOSIS — D179 Benign lipomatous neoplasm, unspecified: Secondary | ICD-10-CM | POA: Insufficient documentation

## 2011-12-07 DIAGNOSIS — Z7289 Other problems related to lifestyle: Secondary | ICD-10-CM | POA: Insufficient documentation

## 2011-12-07 DIAGNOSIS — Z0000 Encounter for general adult medical examination without abnormal findings: Secondary | ICD-10-CM | POA: Insufficient documentation

## 2011-12-07 NOTE — Assessment & Plan Note (Signed)
Controlled on home checks, needs to work on weight, diet, etoh.  D/w pt.

## 2011-12-07 NOTE — Assessment & Plan Note (Signed)
2 lipomas noted on back during exam.  Not ttp, would follow clinically.  If bothersome, the refer to surgery.  D/w pt.

## 2011-12-07 NOTE — Assessment & Plan Note (Signed)
D/w pt about etoh cessation.

## 2011-12-07 NOTE — Assessment & Plan Note (Signed)
Encouraged f/u with cards.  

## 2011-12-07 NOTE — Assessment & Plan Note (Signed)
Continue SSRI.  D/w pt about cutting back on etoh.

## 2011-12-12 ENCOUNTER — Other Ambulatory Visit: Payer: Self-pay | Admitting: Cardiology

## 2012-03-08 ENCOUNTER — Encounter: Payer: Self-pay | Admitting: Family Medicine

## 2012-03-08 ENCOUNTER — Ambulatory Visit (INDEPENDENT_AMBULATORY_CARE_PROVIDER_SITE_OTHER): Payer: PRIVATE HEALTH INSURANCE | Admitting: Family Medicine

## 2012-03-08 VITALS — BP 130/84 | HR 88 | Temp 99.1°F | Wt 210.8 lb

## 2012-03-08 DIAGNOSIS — R059 Cough, unspecified: Secondary | ICD-10-CM

## 2012-03-08 DIAGNOSIS — R05 Cough: Secondary | ICD-10-CM

## 2012-03-08 MED ORDER — ALBUTEROL SULFATE HFA 108 (90 BASE) MCG/ACT IN AERS: 2.0000 | INHALATION_SPRAY | Freq: Four times a day (QID) | RESPIRATORY_TRACT | Status: DC | PRN

## 2012-03-08 MED ORDER — AZITHROMYCIN 250 MG PO TABS: ORAL_TABLET | ORAL | Status: DC

## 2012-03-08 NOTE — Progress Notes (Signed)
duration of symptoms: 4-5 days Rhinorrhea: no Congestion: no ear pain: no but feel full sore throat: no but scratchy Cough: yes, with brown sputum, coughing until vomiting Myalgias: yes, diffuse, more than normal.   Smoking 1 PPD, until she got sick and cut back to 1/2 ppd.   Some wheeze, more than normal.   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    Was still drinking about a 5th a week.  D/w pt about cutting back.   ROS: See HPI.  Otherwise negative.    Meds, vitals, and allergies reviewed.   GEN: nad, alert and oriented HEENT: mucous membranes moist, TM w/o erythema, nasal epithelium injected, OP with cobblestoning NECK: supple w/o LA CV: rrr. PULM: coarse BS with B exp wheeze but no focal dec in BS, no inc wob ABD: soft, +bs EXT: no edema

## 2012-03-08 NOTE — Patient Instructions (Addendum)
Please call about going to see cardiology.   Use the inhaler for the cough.  Start the antibiotics today.  I would get a flu shot each fall.   Take care.

## 2012-03-09 DIAGNOSIS — R05 Cough: Secondary | ICD-10-CM | POA: Insufficient documentation

## 2012-03-09 DIAGNOSIS — R059 Cough, unspecified: Secondary | ICD-10-CM | POA: Insufficient documentation

## 2012-03-09 NOTE — Assessment & Plan Note (Signed)
D/w pt about smoking cessation. Use SABA now given the cough, start zmax for presumed bronchitis and f/u prn.  Nontoxic.  She agrees, understood.

## 2012-12-13 ENCOUNTER — Other Ambulatory Visit: Payer: Self-pay | Admitting: Family Medicine

## 2012-12-13 NOTE — Telephone Encounter (Signed)
Sent!

## 2012-12-13 NOTE — Telephone Encounter (Signed)
Received refill request electronically. Last office visit 02/05/13. Has appointment scheduled this month. Is it okay to refill medication?

## 2012-12-15 ENCOUNTER — Other Ambulatory Visit: Payer: Self-pay | Admitting: Family Medicine

## 2012-12-15 DIAGNOSIS — I10 Essential (primary) hypertension: Secondary | ICD-10-CM

## 2012-12-20 ENCOUNTER — Other Ambulatory Visit (INDEPENDENT_AMBULATORY_CARE_PROVIDER_SITE_OTHER): Payer: PRIVATE HEALTH INSURANCE

## 2012-12-20 DIAGNOSIS — K760 Fatty (change of) liver, not elsewhere classified: Secondary | ICD-10-CM

## 2012-12-20 DIAGNOSIS — K7689 Other specified diseases of liver: Secondary | ICD-10-CM

## 2012-12-20 DIAGNOSIS — I10 Essential (primary) hypertension: Secondary | ICD-10-CM

## 2012-12-20 DIAGNOSIS — E7800 Pure hypercholesterolemia, unspecified: Secondary | ICD-10-CM

## 2012-12-20 DIAGNOSIS — Z0000 Encounter for general adult medical examination without abnormal findings: Secondary | ICD-10-CM

## 2012-12-20 LAB — COMPREHENSIVE METABOLIC PANEL
Albumin: 3.8 g/dL (ref 3.5–5.2)
Alkaline Phosphatase: 76 U/L (ref 39–117)
CO2: 29 mEq/L (ref 19–32)
Calcium: 8.8 mg/dL (ref 8.4–10.5)
Chloride: 105 mEq/L (ref 96–112)
GFR: 81.78 mL/min (ref >60.00)
Glucose, Bld: 100 mg/dL — ABNORMAL HIGH (ref 70–99)
Potassium: 4.1 mEq/L (ref 3.5–5.1)
Sodium: 140 mEq/L (ref 135–145)
Total Protein: 6.9 g/dL (ref 6.0–8.3)

## 2012-12-20 LAB — LIPID PANEL
Cholesterol: 183 mg/dL (ref 0–200)
LDL Cholesterol: 117 mg/dL — ABNORMAL HIGH (ref 0–99)

## 2012-12-24 ENCOUNTER — Other Ambulatory Visit (HOSPITAL_COMMUNITY)
Admission: RE | Admit: 2012-12-24 | Discharge: 2012-12-24 | Disposition: A | Discharge status: Home / Self Care | Payer: 59 | Source: Ambulatory Visit | Attending: Family Medicine | Admitting: Family Medicine

## 2012-12-24 ENCOUNTER — Encounter: Payer: Self-pay | Admitting: Internal Medicine

## 2012-12-24 ENCOUNTER — Ambulatory Visit (INDEPENDENT_AMBULATORY_CARE_PROVIDER_SITE_OTHER): Payer: PRIVATE HEALTH INSURANCE | Admitting: Family Medicine

## 2012-12-24 ENCOUNTER — Encounter: Payer: Self-pay | Admitting: Family Medicine

## 2012-12-24 VITALS — BP 124/82 | HR 72 | Temp 98.3°F | Wt 221.5 lb

## 2012-12-24 DIAGNOSIS — I779 Disorder of arteries and arterioles, unspecified: Secondary | ICD-10-CM

## 2012-12-24 DIAGNOSIS — Z1151 Encounter for screening for human papillomavirus (HPV): Secondary | ICD-10-CM | POA: Insufficient documentation

## 2012-12-24 DIAGNOSIS — Z01419 Encounter for gynecological examination (general) (routine) without abnormal findings: Secondary | ICD-10-CM | POA: Insufficient documentation

## 2012-12-24 DIAGNOSIS — Z0000 Encounter for general adult medical examination without abnormal findings: Secondary | ICD-10-CM

## 2012-12-24 DIAGNOSIS — F17200 Nicotine dependence, unspecified, uncomplicated: Secondary | ICD-10-CM

## 2012-12-24 DIAGNOSIS — F411 Generalized anxiety disorder: Secondary | ICD-10-CM

## 2012-12-24 DIAGNOSIS — I10 Essential (primary) hypertension: Secondary | ICD-10-CM

## 2012-12-24 DIAGNOSIS — Z1211 Encounter for screening for malignant neoplasm of colon: Secondary | ICD-10-CM

## 2012-12-24 MED ORDER — SERTRALINE HCL 100 MG PO TABS: 50.0000 mg | ORAL_TABLET | Freq: Every day | ORAL | Status: DC

## 2012-12-24 MED ORDER — METOPROLOL SUCCINATE ER 25 MG PO TB24: 25.0000 mg | ORAL_TABLET | Freq: Every day | ORAL | Status: DC

## 2012-12-24 NOTE — Progress Notes (Signed)
CPE- See plan.  Routine anticipatory guidance given to patient.  See health maintenance. Tetanus 2013 Flu shot- encouraged Tobacco and etoh discussed, precontemplative on both.   Living will, encouraged.  Husband designated if incapacitated.  Mammogram due.  She'll call about this.  Pap due.  Periods about every 4 months.  No sig hot flashes.   D/w patient NW:[id number] for colon cancer screening, including IFOB vs. colonoscopy.  Risks and benefits of both were discussed and patient voiced understanding.  Pt elects for: colonoscopy.   Diet and exercise- "No good."  Encouraged both.    encouraged f/u aortic findings.    Hypertension:    Using medication without problems or lightheadedness: no Chest pain with exertion:no Edema:no Short of breath:no change from baseline, likely smoking related.    Anxiety discussed, along with etoh.  Improved on SSRI.    PMH and SH reviewed  Meds, vitals, and allergies reviewed.   ROS: See HPI.  Otherwise negative.    GEN: nad, alert and oriented HEENT: mucous membranes moist NECK: supple w/o LA CV: rrr. PULM: ctab, no inc wob ABD: soft, +bs EXT: no edema SKIN: no acute rash Breast exam: No mass, nodules, thickening, tenderness, bulging, retraction, inflamation, nipple discharge or skin changes noted.  No axillary or clavicular LA.  Chaperoned exam.  Normal introitus for age, no external lesions, no vaginal discharge, mucosa pink and moist, no vaginal or cervical lesions, no vaginal atrophy, no friaility or hemorrhage, normal uterus size and position, no adnexal masses or tenderness.  Chaperoned exam. Pap collected.

## 2012-12-24 NOTE — Assessment & Plan Note (Signed)
Offered support ZO:[id number].  Precontemplative.

## 2012-12-24 NOTE — Patient Instructions (Addendum)
Call about a mammogram.  See Bonita Quin about your referral before you leave today. Don't change your meds in the meantime.   Take care.  I would get a flu shot each fall.

## 2012-12-24 NOTE — Assessment & Plan Note (Signed)
Continue SSRI, encouraged taper etoh.  Okay for outpatient fu.

## 2012-12-24 NOTE — Addendum Note (Signed)
Addended by: Annamarie Major on: 12/24/2012 10:03 AM   Modules accepted: Orders

## 2012-12-24 NOTE — Assessment & Plan Note (Signed)
Encouraged cards f/u.  

## 2012-12-24 NOTE — Assessment & Plan Note (Signed)
Okay for now, continue current med.  Discussed tob, weight, etc.

## 2012-12-24 NOTE — Assessment & Plan Note (Signed)
Encouraged taper. 

## 2012-12-24 NOTE — Assessment & Plan Note (Signed)
Routine anticipatory guidance given to patient.  See health maintenance. Tetanus 2013 Flu shot- encouraged Tobacco and etoh discussed, precontemplative on both.   Living will, encouraged.  Husband designated if incapacitated.  Mammogram due.  She'll call about this.  Pap due.  Periods about every 4 months.  No sig hot flashes.   D/w patient WG:NFAOZHY for colon cancer screening, including IFOB vs. colonoscopy.  Risks and benefits of both were discussed and patient voiced understanding.  Pt elects for: colonoscopy.   Diet and exercise- "No good."  Encouraged both.

## 2013-02-28 ENCOUNTER — Encounter: Payer: Self-pay | Admitting: Cardiology

## 2013-02-28 ENCOUNTER — Ambulatory Visit (INDEPENDENT_AMBULATORY_CARE_PROVIDER_SITE_OTHER): Payer: 59 | Admitting: Cardiology

## 2013-02-28 VITALS — BP 160/78 | HR 69 | Ht 67.0 in | Wt 223.0 lb

## 2013-02-28 DIAGNOSIS — I10 Essential (primary) hypertension: Secondary | ICD-10-CM

## 2013-02-28 DIAGNOSIS — E7800 Pure hypercholesterolemia, unspecified: Secondary | ICD-10-CM

## 2013-02-28 DIAGNOSIS — I779 Disorder of arteries and arterioles, unspecified: Secondary | ICD-10-CM

## 2013-02-28 DIAGNOSIS — R079 Chest pain, unspecified: Secondary | ICD-10-CM

## 2013-02-28 DIAGNOSIS — F17200 Nicotine dependence, unspecified, uncomplicated: Secondary | ICD-10-CM

## 2013-02-28 DIAGNOSIS — I771 Stricture of artery: Secondary | ICD-10-CM

## 2013-02-28 NOTE — Patient Instructions (Addendum)
Schedule an appointment for an MRA of your chest.   Your physician recommends that you return for a FASTING lipomed profile.   Your physician has requested that you regularly monitor and record your blood pressure readings at home. Please use the same machine at the same time of day to check your readings and record them. I will call you in 2 weeks to get the readings. Nichole Burke  Your physician wants you to follow-up in: 1 year with Dr Shirlee Latch. (October 2015).  You will receive a reminder letter in the mail two months in advance. If you don't receive a letter, please call our office to schedule the follow-up appointment.

## 2013-02-28 NOTE — Progress Notes (Signed)
Patient ID: Nichole Burke, female   DOB: Apr 10, 1961, 52 y.o.   MRN: [id number] PCP: Dr. Para March  52 yo with history of HTN and smoking presents for cardiology followup.  She had a stress echo in 7/12 for atypical chest pain that was normal.  She had a mildly dilated ascending aorta back in 6/12.    Lately, she has been stable symptomatically.  She has rare atypical chest pain.  No exertional dyspnea.  No claudication.  She continues to smoke.  Also, BP is high today.    ECG: NSR, left axis deviation  Labs (9/11): LDL 122 Labs (8/14): K 4.1, creatinine 0.8, AST 47, ALT 54, LDL 117, HDL 50  PMH: 1. Active smoker 2. HTN 3. Fatty liver with mildly increased LFTs.  Suspect may be from ETOH.  4. Dilation ascending aorta: 3.7 cm on chest CT in 6/12.  5. Atypical chest pain.  6. Depression 7. Chest pain: Stress echo in 7/12 was normal.   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   FH: Father with MI at 34, Mother with MI at 39  ROS: All systems reviewed and negative except as per HPI.   Current Outpatient Prescriptions  Medication Sig Dispense Refill  . albuterol (PROVENTIL HFA;VENTOLIN HFA) 108 (90 BASE) MCG/ACT inhaler Inhale 2 puffs into the lungs every 6 (six) hours as needed for wheezing.  1 Inhaler  0  . aspirin 81 MG tablet Take 81 mg by mouth daily.        . metoprolol succinate (TOPROL-XL) 25 MG 24 hr tablet Take 1 tablet (25 mg total) by mouth daily.  90 tablet  3  . Multiple Vitamin (MULTIVITAMIN) tablet Take 1 tablet by mouth daily.      . naproxen sodium (ANAPROX) 220 MG tablet Take 220 mg by mouth every morning.      . sertraline (ZOLOFT) 100 MG tablet Take 0.5 tablets (50 mg total) by mouth daily.  45 tablet  3   No current facility-administered medications for this visit.    BP 160/78  Pulse 69  Ht 5\' 7"  (1.702 m)  Wt 101.152 kg (223 lb)  BMI 34.92 kg/m2 General: NAD, overweight Neck: No JVD, no  thyromegaly or thyroid nodule.  Lungs: mildly prolonged expiratory phase CV: Nondisplaced PMI.  Heart regular S1/S2, no S3/S4, no murmur.  1+ bilateral ankle edema.  No carotid bruit.  Normal pedal pulses.  Abdomen: Soft, nontender, no hepatosplenomegaly, no distention.  Neurologic: Alert and oriented x 3.  Psych: Normal affect. Extremities: No clubbing or cyanosis.   Assessment/Plan: 1. Atypical chest pain: Likely noncardiac.  She had a negative stress echo in 7/12.  2. HTN: BP high today but has been normal recently at PCP's office.  She has a BP cuff at home.  She will check BP daily and record.  We will call her in 2 wks to see what BP is running.  3. Dilated ascending aorta: Mildly dilated ascending aorta to 3.7 cm in 2012.  I will get a MRA chest to reassess the ascending thoracic aorta.  She will need good blood pressure control to limit worsening of dilation.  4. Hyperlipidemia: She is not on a statin. She does have a strong family history of premature CAD.  I will have her get a Lipomed profile.  If this is intermediate to high risk, will start statin.  5. Smoking: I strongly encouraged her to quit.  This  is especially dangerous given her family history of CAD.   Marca Ancona 02/28/2013

## 2013-03-01 ENCOUNTER — Other Ambulatory Visit (INDEPENDENT_AMBULATORY_CARE_PROVIDER_SITE_OTHER): Payer: 59

## 2013-03-01 DIAGNOSIS — I779 Disorder of arteries and arterioles, unspecified: Secondary | ICD-10-CM

## 2013-03-01 DIAGNOSIS — I771 Stricture of artery: Secondary | ICD-10-CM

## 2013-03-01 LAB — BASIC METABOLIC PANEL
CO2: 28 mEq/L (ref 19–32)
Creatinine, Ser: 0.8 mg/dL (ref 0.4–1.2)
Glucose, Bld: 101 mg/dL — ABNORMAL HIGH (ref 70–99)
Potassium: 4.3 mEq/L (ref 3.5–5.1)
Sodium: 138 mEq/L (ref 135–145)

## 2013-03-07 ENCOUNTER — Ambulatory Visit (AMBULATORY_SURGERY_CENTER): Payer: Self-pay | Admitting: *Deleted

## 2013-03-07 VITALS — Ht 66.0 in | Wt 224.4 lb

## 2013-03-07 DIAGNOSIS — Z1211 Encounter for screening for malignant neoplasm of colon: Secondary | ICD-10-CM

## 2013-03-07 MED ORDER — MOVIPREP 100 G PO SOLR: 1.0000 | Freq: Once | ORAL | Status: DC

## 2013-03-07 NOTE — Progress Notes (Signed)
No egg or soy allergy. ewm No home 02 use. ewm No problems with past sedation. ewm Pt declined emmi video. ewm

## 2013-03-11 ENCOUNTER — Ambulatory Visit
Admission: RE | Admit: 2013-03-11 | Discharge: 2013-03-11 | Disposition: A | Discharge status: Home / Self Care | Payer: 59 | Source: Ambulatory Visit | Attending: Cardiology | Admitting: Cardiology

## 2013-03-11 DIAGNOSIS — I779 Disorder of arteries and arterioles, unspecified: Secondary | ICD-10-CM

## 2013-03-11 DIAGNOSIS — I771 Stricture of artery: Secondary | ICD-10-CM

## 2013-03-11 MED ORDER — GADOBENATE DIMEGLUMINE 529 MG/ML IV SOLN
19.0000 mL | Freq: Once | INTRAVENOUS | Status: AC | PRN
  Administered 2013-03-11: 19 mL via INTRAVENOUS

## 2013-03-14 ENCOUNTER — Telehealth: Payer: Self-pay | Admitting: *Deleted

## 2013-03-14 DIAGNOSIS — I10 Essential (primary) hypertension: Secondary | ICD-10-CM

## 2013-03-14 NOTE — Progress Notes (Signed)
Pt.notified

## 2013-03-14 NOTE — Telephone Encounter (Signed)
HTN: BP high today but has been normal recently at PCP's office. She has a BP cuff at home. She will check BP daily and record. We will call her in 2 wks to see what BP is running.   03/14/13 spoke with patient about BP--03/11/13 BP at [facility] 155/95.  Pt not sure BP cuff at home is working right, her readings at home have ranged from--130-197/75-95. I will forward to Dr Shirlee Latch for review.

## 2013-03-14 NOTE — Telephone Encounter (Signed)
BP sounds like it is running high.  Would start lisinopril 10 mg daily with BMET in 2 wks.

## 2013-03-16 MED ORDER — LISINOPRIL 10 MG PO TABS: 10.0000 mg | ORAL_TABLET | Freq: Every day | ORAL | Status: DC

## 2013-03-16 NOTE — Telephone Encounter (Signed)
Pt advised,verbalized understanding. 

## 2013-03-16 NOTE — Addendum Note (Signed)
Addended by: Jacqlyn Krauss on: 03/16/2013 08:55 AM   Modules accepted: Orders

## 2013-03-21 ENCOUNTER — Encounter: Payer: Self-pay | Admitting: Internal Medicine

## 2013-03-21 ENCOUNTER — Ambulatory Visit (AMBULATORY_SURGERY_CENTER): Payer: 59 | Admitting: Internal Medicine

## 2013-03-21 VITALS — BP 147/91 | HR 65 | Temp 98.7°F | Resp 19 | Ht 66.0 in | Wt 224.0 lb

## 2013-03-21 DIAGNOSIS — D126 Benign neoplasm of colon, unspecified: Secondary | ICD-10-CM

## 2013-03-21 DIAGNOSIS — Z1211 Encounter for screening for malignant neoplasm of colon: Secondary | ICD-10-CM

## 2013-03-21 MED ORDER — SODIUM CHLORIDE 0.9 % IV SOLN: 500.0000 mL | INTRAVENOUS | Status: DC

## 2013-03-21 NOTE — Progress Notes (Signed)
Called to room to assist during endoscopic procedure.  Patient ID and intended procedure confirmed with present staff. Received instructions for my participation in the procedure from the performing physician.  

## 2013-03-21 NOTE — Op Note (Signed)
 [facility] [address] [place], [address]   COLONOSCOPY PROCEDURE REPORT  PATIENT: Nichole Burke, Nichole Burke  MR#: [id number] BIRTHDATE: June 16, 1960 , 52  yrs. old GENDER: Female ENDOSCOPIST: Beverley Fiedler, MD REFERRED XL:KGMWNU Duncan, M.D. PROCEDURE DATE:  03/21/2013 PROCEDURE:   Colonoscopy with snare polypectomy First Screening Colonoscopy - Avg.  risk and is 50 yrs.  old or older Yes.  Prior Negative Screening - Now for repeat screening. N/A  History of Adenoma - Now for follow-up colonoscopy & has been > or = to 3 yrs.  N/A  Polyps Removed Today? Yes. ASA CLASS:   Class II INDICATIONS:average risk screening and first colonoscopy. MEDICATIONS: MAC sedation, administered by CRNA and propofol (Diprivan) 200mg  IV  DESCRIPTION OF PROCEDURE:   After the risks benefits and alternatives of the procedure were thoroughly explained, informed consent was obtained.  A digital rectal exam revealed no rectal mass.   The LB PFC-H190 [id number]  endoscope was introduced through the anus and advanced to the cecum, which was identified by both the appendix and ileocecal valve. No adverse events experienced. The quality of the prep was good, using MoviPrep  The instrument was then slowly withdrawn as the colon was fully examined.   COLON FINDINGS: A pedunculated polyp measuring 12 mm in size was found in the sigmoid colon.  A polypectomy was performed using snare cautery.  The resection was complete and the polyp tissue was completely retrieved.  Retroflexed views revealed internal hemorrhoids. The time to cecum=1 minutes 47 seconds.  Withdrawal time=10 minutes 15 seconds.  The scope was withdrawn and the procedure completed.  COMPLICATIONS: There were no complications.  ENDOSCOPIC IMPRESSION: Pedunculated polyp measuring 12 mm in size was found in the sigmoid colon; polypectomy was performed using snare cautery  RECOMMENDATIONS: 1.  Hold aspirin, aspirin products, and  anti-inflammatory medication for 2 weeks. 2.  Repeat Colonoscopy in 3 years. 3.  You will receive a letter within 1-2 weeks with the results of your biopsy as well as final recommendations.  Please call my office if you have not received a letter after 3 weeks.   eSigned:  Beverley Fiedler, MD 03/21/2013 10:18 AM   cc: The Patient and Crawford Givens, MD

## 2013-03-21 NOTE — Progress Notes (Signed)
Patient did not experience any of the following events: a burn prior to discharge; a fall within the facility; wrong site/side/patient/procedure/implant event; or a hospital transfer or hospital admission upon discharge from the facility. (G8907)Patient did not have preoperative order for IV antibiotic SSI prophylaxis. (G8918) ewm 

## 2013-03-21 NOTE — Progress Notes (Signed)
Stable to RR 

## 2013-03-21 NOTE — Patient Instructions (Signed)
YOU HAD AN ENDOSCOPIC PROCEDURE TODAY AT THE [facility]: Refer to the procedure report that was given to you for any specific questions about what was found during the examination.  If the procedure report does not answer your questions, please call your gastroenterologist to clarify.  If you requested that your care partner not be given the details of your procedure findings, then the procedure report has been included in a sealed envelope for you to review at your convenience later.  YOU SHOULD EXPECT: Some feelings of bloating in the abdomen. Passage of more gas than usual.  Walking can help get rid of the air that was put into your GI tract during the procedure and reduce the bloating. If you had a lower endoscopy (such as a colonoscopy or flexible sigmoidoscopy) you may notice spotting of blood in your stool or on the toilet paper. If you underwent a bowel prep for your procedure, then you may not have a normal bowel movement for a few days.  DIET: Your first meal following the procedure should be a light meal and then it is ok to progress to your normal diet.  A half-sandwich or bowl of soup is an example of a good first meal.  Heavy or fried foods are harder to digest and may make you feel nauseous or bloated.  Likewise meals heavy in dairy and vegetables can cause extra gas to form and this can also increase the bloating.  Drink plenty of fluids but you should avoid alcoholic beverages for 24 hours.  ACTIVITY: Your care partner should take you home directly after the procedure.  You should plan to take it easy, moving slowly for the rest of the day.  You can resume normal activity the day after the procedure however you should NOT DRIVE or use heavy machinery for 24 hours (because of the sedation medicines used during the test).    SYMPTOMS TO REPORT IMMEDIATELY: A gastroenterologist can be reached at any hour.  During normal business hours, 8:30 AM to 5:00 PM Monday through Friday,  call [phone].  After hours and on weekends, please call the GI answering service at [phone] who will take a message and have the physician on call contact you.   Following lower endoscopy (colonoscopy or flexible sigmoidoscopy):  Excessive amounts of blood in the stool  Significant tenderness or worsening of abdominal pains  Swelling of the abdomen that is new, acute  Fever of 100F or higher    FOLLOW UP: If any biopsies were taken you will be contacted by phone or by letter within the next 1-3 weeks.  Call your gastroenterologist if you have not heard about the biopsies in 3 weeks.  Our staff will call the home number listed on your records the next business day following your procedure to check on you and address any questions or concerns that you may have at that time regarding the information given to you following your procedure. This is a courtesy call and so if there is no answer at the home number and we have not heard from you through the emergency physician on call, we will assume that you have returned to your regular daily activities without incident.  SIGNATURES/CONFIDENTIALITY: You and/or your care partner have signed paperwork which will be entered into your electronic medical record.  These signatures attest to the fact that that the information above on your After Visit Summary has been reviewed and is understood.  Full responsibility of the confidentiality   of this discharge information lies with you and/or your care-partner.     

## 2013-03-22 ENCOUNTER — Telehealth: Payer: Self-pay | Admitting: *Deleted

## 2013-03-22 NOTE — Telephone Encounter (Signed)
  Follow up Call-  Call back number 03/21/2013  Post procedure Call Back phone  # [phone]  Permission to leave phone message Yes     Patient questions:  Do you have a fever, pain , or abdominal swelling? no Pain Score  0 *  Have you tolerated food without any problems? yes  Have you been able to return to your normal activities? yes  Do you have any questions about your discharge instructions: Diet   no Medications  no Follow up visit  no  Do you have questions or concerns about your Care? no  Actions: * If pain score is 4 or above: No action needed, pain <4.

## 2013-03-25 ENCOUNTER — Encounter: Payer: Self-pay | Admitting: Internal Medicine

## 2013-03-30 ENCOUNTER — Other Ambulatory Visit (INDEPENDENT_AMBULATORY_CARE_PROVIDER_SITE_OTHER): Payer: 59

## 2013-03-30 DIAGNOSIS — I10 Essential (primary) hypertension: Secondary | ICD-10-CM

## 2013-03-30 LAB — BASIC METABOLIC PANEL
CO2: 30 mEq/L (ref 19–32)
Calcium: 9.5 mg/dL (ref 8.4–10.5)
Chloride: 104 mEq/L (ref 96–112)
Creatinine, Ser: 1 mg/dL (ref 0.4–1.2)
Glucose, Bld: 108 mg/dL — ABNORMAL HIGH (ref 70–99)
Sodium: 138 mEq/L (ref 135–145)

## 2013-05-26 ENCOUNTER — Ambulatory Visit (INDEPENDENT_AMBULATORY_CARE_PROVIDER_SITE_OTHER): Payer: 59 | Admitting: Family Medicine

## 2013-05-26 ENCOUNTER — Encounter: Payer: Self-pay | Admitting: Family Medicine

## 2013-05-26 VITALS — BP 142/80 | HR 66 | Temp 98.5°F | Wt 218.8 lb

## 2013-05-26 DIAGNOSIS — F17200 Nicotine dependence, unspecified, uncomplicated: Secondary | ICD-10-CM

## 2013-05-26 DIAGNOSIS — L989 Disorder of the skin and subcutaneous tissue, unspecified: Secondary | ICD-10-CM

## 2013-05-26 DIAGNOSIS — Z0000 Encounter for general adult medical examination without abnormal findings: Secondary | ICD-10-CM

## 2013-05-26 DIAGNOSIS — M549 Dorsalgia, unspecified: Secondary | ICD-10-CM | POA: Insufficient documentation

## 2013-05-26 MED ORDER — CYCLOBENZAPRINE HCL 10 MG PO TABS: 5.0000 mg | ORAL_TABLET | Freq: Three times a day (TID) | ORAL | Status: DC | PRN

## 2013-05-26 NOTE — Patient Instructions (Signed)
Take the flexeril for the pain and let me know if that doesn't help.  If can make you drowsy.  Call back to schedule a 37min visit for me to take the skin spot off.  Take care.

## 2013-05-26 NOTE — Assessment & Plan Note (Signed)
D/w pt.  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  Can return for biopsy.  She agrees.

## 2013-05-26 NOTE — Assessment & Plan Note (Signed)
Appears to be in the chest wall, not intrathoracic, and would use flexeril for now and report back as needed.  She agrees.

## 2013-05-26 NOTE — Assessment & Plan Note (Signed)
Likely affecting sensation in the OP.  Exam unremarkable. Encouraged cessation.

## 2013-05-26 NOTE — Progress Notes (Signed)
Pre-visit discussion using our clinic review tool. No additional management support is needed unless otherwise documented below in the visit note.  Last two nights with upper R sided back pain.  Waking up with pain.  Felt like a muscle spasm.  Less painful during the day.  Pain with a deep breath.  No L sided sx.  No rash.  No FCNAV.  No diarrhea.  Minimal cough.  No injury.  H/o similar prev, would self resolve.  Not worse with eating.    Sensation of dysphagia when laying down. Not a problem when upright.  Tickle in the throat. Smoking, not ready to quit. Swallowing food and liquids okay.  Drinking some water or gargling helps some.   Spot behind R ear. Present for a few months.  Will scab over but doesn't bleed. Not painful but can be sore.  Meds, vitals, and allergies reviewed.   ROS: See HPI.  Otherwise, noncontributory.  nad ncat 5 mm ulcerated area posterior to the R ear near the hairline Nasal and OP exam wnl Neck supple, no LA rrr ctab abd soft Back w/o midline pain Lipoma noted on the R upper back. Posterior chest wall ttp inferior to the lipoma

## 2013-06-07 ENCOUNTER — Telehealth: Payer: Self-pay | Admitting: Family Medicine

## 2013-06-07 NOTE — Telephone Encounter (Signed)
Relevant patient education mailed to patient.  

## 2013-06-09 ENCOUNTER — Other Ambulatory Visit: Payer: Self-pay | Admitting: Cardiology

## 2013-06-10 ENCOUNTER — Other Ambulatory Visit: Payer: Self-pay | Admitting: Family Medicine

## 2013-06-13 NOTE — Telephone Encounter (Signed)
Received refill request electronically. Last office visit 05/26/13. Is it okay to refill medication?

## 2013-06-13 NOTE — Telephone Encounter (Signed)
Sent!

## 2013-07-08 ENCOUNTER — Encounter: Payer: Self-pay | Admitting: Family Medicine

## 2013-07-08 ENCOUNTER — Ambulatory Visit (INDEPENDENT_AMBULATORY_CARE_PROVIDER_SITE_OTHER): Payer: 59 | Admitting: Family Medicine

## 2013-07-08 VITALS — BP 108/80 | HR 94 | Temp 98.1°F | Ht 67.0 in | Wt 212.0 lb

## 2013-07-08 DIAGNOSIS — J209 Acute bronchitis, unspecified: Secondary | ICD-10-CM

## 2013-07-08 MED ORDER — AZITHROMYCIN 250 MG PO TABS: ORAL_TABLET | ORAL | Status: DC

## 2013-07-08 NOTE — Patient Instructions (Addendum)
Mucinex DM twice daily.  Start and complete antibiotics.  Call if feeling worse or if new fever or shortness of breath.

## 2013-07-08 NOTE — Assessment & Plan Note (Signed)
Worsening after 1 week, smoker, change in sputum and possible COPD...treat with antibiotics.

## 2013-07-08 NOTE — Progress Notes (Signed)
Pre visit review using our clinic review tool, if applicable. No additional management support is needed unless otherwise documented below in the visit note. 

## 2013-07-08 NOTE — Progress Notes (Signed)
   Subjective:    Patient ID: Nichole Burke, female    DOB: 07/15/1960, 53 y.o.   MRN: [id number]  Cough This is a new problem. The current episode started in the past 7 days. The problem has been gradually worsening. The cough is productive of sputum. Associated symptoms include ear pain, headaches, myalgias, nasal congestion, a sore throat and wheezing. Pertinent negatives include no chills, fever, postnasal drip, rhinorrhea or shortness of breath. Associated symptoms comments: No sinus pain  2 days ago right ear pain, better now. The symptoms are aggravated by lying down (cough keping her up at night). Risk factors for lung disease include smoking/tobacco exposure (current smoker). She has tried OTC cough suppressant for the symptoms. The treatment provided mild relief. There is no history of asthma, bronchiectasis, bronchitis, COPD, emphysema or pneumonia.  Headache  Associated symptoms include coughing, ear pain and a sore throat. Pertinent negatives include no fever or rhinorrhea.      Review of Systems  Constitutional: Negative for fever and chills.  HENT: Positive for ear pain and sore throat. Negative for postnasal drip and rhinorrhea.   Respiratory: Positive for cough and wheezing. Negative for shortness of breath.   Musculoskeletal: Positive for myalgias.  Neurological: Positive for headaches.       Objective:   Physical Exam  Constitutional: Vital signs are normal. She appears well-developed and well-nourished. She is cooperative.  Non-toxic appearance. She does not appear ill. No distress.  HENT:  Head: Normocephalic.  Right Ear: Hearing, tympanic membrane, external ear and ear canal normal. Tympanic membrane is not erythematous, not retracted and not bulging.  Left Ear: Hearing, tympanic membrane, external ear and ear canal normal. Tympanic membrane is not erythematous, not retracted and not bulging.  Nose: Mucosal edema and rhinorrhea present. Right sinus exhibits no  maxillary sinus tenderness and no frontal sinus tenderness. Left sinus exhibits no maxillary sinus tenderness and no frontal sinus tenderness.  Mouth/Throat: Uvula is midline, oropharynx is clear and moist and mucous membranes are normal.  Eyes: Conjunctivae, EOM and lids are normal. Pupils are equal, round, and reactive to light. Lids are everted and swept, no foreign bodies found.  Neck: Trachea normal and normal range of motion. Neck supple. Carotid bruit is not present. No mass and no thyromegaly present.  Cardiovascular: Normal rate, regular rhythm, S1 normal, S2 normal, normal heart sounds, intact distal pulses and normal pulses.  Exam reveals no gallop and no friction rub.   No murmur heard. Pulmonary/Chest: Effort normal and breath sounds normal. Not tachypneic. No respiratory distress. She has no decreased breath sounds. She has no wheezes. She has no rhonchi. She has no rales.  Neurological: She is alert.  Skin: Skin is warm, dry and intact. No rash noted.  Psychiatric: Her speech is normal and behavior is normal. Judgment normal. Her mood appears not anxious. Cognition and memory are normal. She does not exhibit a depressed mood.          Assessment & Plan:

## 2013-09-08 ENCOUNTER — Other Ambulatory Visit: Payer: Self-pay | Admitting: Cardiology

## 2013-09-13 ENCOUNTER — Other Ambulatory Visit: Payer: Self-pay | Admitting: Cardiology

## 2013-10-12 ENCOUNTER — Other Ambulatory Visit: Payer: Self-pay | Admitting: Cardiology

## 2013-12-08 ENCOUNTER — Other Ambulatory Visit: Payer: Self-pay | Admitting: Cardiology

## 2014-01-05 ENCOUNTER — Other Ambulatory Visit: Payer: Self-pay | Admitting: Family Medicine

## 2014-01-11 ENCOUNTER — Other Ambulatory Visit: Payer: Self-pay | Admitting: Family Medicine

## 2014-01-25 ENCOUNTER — Other Ambulatory Visit: Payer: Self-pay | Admitting: Cardiology

## 2014-02-23 ENCOUNTER — Other Ambulatory Visit: Payer: Self-pay | Admitting: Cardiology

## 2014-03-06 ENCOUNTER — Ambulatory Visit (INDEPENDENT_AMBULATORY_CARE_PROVIDER_SITE_OTHER): Payer: 59 | Admitting: Physician Assistant

## 2014-03-06 ENCOUNTER — Encounter: Payer: Self-pay | Admitting: Physician Assistant

## 2014-03-06 VITALS — BP 140/90 | HR 65 | Ht 67.0 in | Wt 227.0 lb

## 2014-03-06 DIAGNOSIS — I10 Essential (primary) hypertension: Secondary | ICD-10-CM

## 2014-03-06 DIAGNOSIS — I779 Disorder of arteries and arterioles, unspecified: Secondary | ICD-10-CM

## 2014-03-06 MED ORDER — LISINOPRIL 20 MG PO TABS: 20.0000 mg | ORAL_TABLET | Freq: Every day | ORAL | Status: DC

## 2014-03-06 NOTE — Patient Instructions (Addendum)
Your physician recommends that you schedule a follow-up appointment in:  2-3 months with Dr. Aundra Dubin  Your physician has recommended you make the following change in your medication:  Increase Lisinopril to 20 mg by mouth daily.   Follow a 2 gm Sodium diet.  Check your blood pressure daily and keep record. Please call our office in 2 weeks with the readings.--[phone]   Low-Sodium Eating Plan Sodium raises blood pressure and causes water to be held in the body. Getting less sodium from food will help lower your blood pressure, reduce any swelling, and protect your heart, liver, and kidneys. We get sodium by adding salt (sodium chloride) to food. Most of our sodium comes from canned, boxed, and frozen foods. Restaurant foods, fast foods, and pizza are also very high in sodium. Even if you take medicine to lower your blood pressure or to reduce fluid in your body, getting less sodium from your food is important. WHAT IS MY PLAN? Most people should limit their sodium intake to 2,300 mg a day. Your health care provider recommends that you limit your sodium intake to ___2000 mg_______ a day.  WHAT DO I NEED TO KNOW ABOUT THIS EATING PLAN? For the low-sodium eating plan, you will follow these general guidelines:  Choose foods with a % Daily Value for sodium of less than 5% (as listed on the food label).   Use salt-free seasonings or herbs instead of table salt or sea salt.   Check with your health care provider or pharmacist before using salt substitutes.   Eat fresh foods.  Eat more vegetables and fruits.  Limit canned vegetables. If you do use them, rinse them well to decrease the sodium.   Limit cheese to 1 oz (28 g) per day.   Eat lower-sodium products, often labeled as "lower sodium" or "no salt added."  Avoid foods that contain monosodium glutamate (MSG). MSG is sometimes added to [place] food and some canned foods.  Check food labels (Nutrition Facts labels) on foods to  learn how much sodium is in one serving.  Eat more home-cooked food and less restaurant, buffet, and fast food.  When eating at a restaurant, ask that your food be prepared with less salt or none, if possible.  HOW DO I READ FOOD LABELS FOR SODIUM INFORMATION? The Nutrition Facts label lists the amount of sodium in one serving of the food. If you eat more than one serving, you must multiply the listed amount of sodium by the number of servings. Food labels may also identify foods as:  Sodium free--Less than 5 mg in a serving.  Very low sodium--35 mg or less in a serving.  Low sodium--140 mg or less in a serving.  Light in sodium--50% less sodium in a serving. For example, if a food that usually has 300 mg of sodium is changed to become light in sodium, it will have 150 mg of sodium.  Reduced sodium--25% less sodium in a serving. For example, if a food that usually has 400 mg of sodium is changed to reduced sodium, it will have 300 mg of sodium. WHAT FOODS CAN I EAT? Grains Low-sodium cereals, including oats, puffed wheat and rice, and shredded wheat cereals. Low-sodium crackers. Unsalted rice and pasta. Lower-sodium bread.  Vegetables Frozen or fresh vegetables. Low-sodium or reduced-sodium canned vegetables. Low-sodium or reduced-sodium tomato sauce and paste. Low-sodium or reduced-sodium tomato and vegetable juices.  Fruits Fresh, frozen, and canned fruit. Fruit juice.  Meat and Other Protein Products Low-sodium canned  tuna and salmon. Fresh or frozen meat, poultry, seafood, and fish. Lamb. Unsalted nuts. Dried beans, peas, and lentils without added salt. Unsalted canned beans. Homemade soups without salt. Eggs.  Dairy Milk. Soy milk. Ricotta cheese. Low-sodium or reduced-sodium cheeses. Yogurt.  Condiments Fresh and dried herbs and spices. Salt-free seasonings. Onion and garlic powders. Low-sodium varieties of mustard and ketchup. Lemon juice.  Fats and  Oils Reduced-sodium salad dressings. Unsalted butter.  Other Unsalted popcorn and pretzels.  The items listed above may not be a complete list of recommended foods or beverages. Contact your dietitian for more options. WHAT FOODS ARE NOT RECOMMENDED? Grains Instant hot cereals. Bread stuffing, pancake, and biscuit mixes. Croutons. Seasoned rice or pasta mixes. Noodle soup cups. Boxed or frozen macaroni and cheese. Self-rising flour. Regular salted crackers. Vegetables Regular canned vegetables. Regular canned tomato sauce and paste. Regular tomato and vegetable juices. Frozen vegetables in sauces. Salted french fries. Olives. Angie Fava. Relishes. Sauerkraut. Salsa. Meat and Other Protein Products Salted, canned, smoked, spiced, or pickled meats, seafood, or fish. Bacon, ham, sausage, hot dogs, corned beef, chipped beef, and packaged luncheon meats. Salt pork. Jerky. Pickled herring. Anchovies, regular canned tuna, and sardines. Salted nuts. Dairy Processed cheese and cheese spreads. Cheese curds. Blue cheese and cottage cheese. Buttermilk.  Condiments Onion and garlic salt, seasoned salt, table salt, and sea salt. Canned and packaged gravies. Worcestershire sauce. Tartar sauce. Barbecue sauce. Teriyaki sauce. Soy sauce, including reduced sodium. Steak sauce. Fish sauce. Oyster sauce. Cocktail sauce. Horseradish. Regular ketchup and mustard. Meat flavorings and tenderizers. Bouillon cubes. Hot sauce. Tabasco sauce. Marinades. Taco seasonings. Relishes. Fats and Oils Regular salad dressings. Salted butter. Margarine. Ghee. Bacon fat.  Other Potato and tortilla chips. Corn chips and puffs. Salted popcorn and pretzels. Canned or dried soups. Pizza. Frozen entrees and pot pies.  The items listed above may not be a complete list of foods and beverages to avoid. Contact your dietitian for more information. Document Released: 10/11/2001 Document Revised: 04/26/2013 Document Reviewed:  02/23/2013 [facility] Patient Information 2015 [place], [place]. This information is not intended to replace advice given to you by your health care provider. Make sure you discuss any questions you have with your health care provider.

## 2014-03-06 NOTE — Assessment & Plan Note (Signed)
MRA in 03/2013 showed stable appearance of descending thoracic aorta,3.7 cm. Followup with Dr. Algernon Huxley.

## 2014-03-06 NOTE — Assessment & Plan Note (Signed)
Blood pressure is elevated today. Increase lisinopril to 20 mg daily. Recommend 2 g sodium diet and weight loss program. Recommend regular blood pressure checks and call as with results.

## 2014-03-06 NOTE — Progress Notes (Signed)
HYQ:MVHQ is a 53 year old female patient of Dr. Aundra Dubin who has a history of hypertension and smoking. She had a stress test in 2012 for atypical chest pain that was normal. She had a mildly dilated  ascending aorta back in 10/2010.MRA in 03/2013 showed stable appearance of descending thoracic aorta with a maximum diameter of 3.7 cm. No evidence of aortic dissection.  Patient comes in today for yearly followup. Overall she feels well without chest pain palpitations, dyspnea, dyspnea on exertion, dizziness or presyncope. Her blood pressure is elevated. 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 She does not exercise regularly. She doesn't get her blood pressure checked on a regular basis.patient complains of some fatigue.    No Known Allergies   Current Outpatient Prescriptions  Medication Sig Dispense Refill  . aspirin 81 MG tablet Take 81 mg by mouth daily.      Marland Kitchen lisinopril (PRINIVIL,ZESTRIL) 10 MG tablet TAKE 1 TABLET (10 MG TOTAL) BY MOUTH DAILY. 30 tablet 0  . metoprolol succinate (TOPROL-XL) 25 MG 24 hr tablet TAKE 1 TABLET (25 MG TOTAL) BY MOUTH DAILY. 90 tablet 1  . Multiple Vitamin (MULTIVITAMIN) tablet Take 1 tablet by mouth daily.    . naproxen sodium (ANAPROX) 220 MG tablet Take 220 mg by mouth every morning.    . sertraline (ZOLOFT) 100 MG tablet TAKE ONE-HALF TABLETS BY MOUTH ONCE DAILY 45 tablet 3   No current facility-administered medications for this visit.    Past Medical History  Diagnosis Date  . HTN (hypertension)   . Anxiety   . Plantar fasciitis     Right  . Tobacco abuse   . Pure hypercholesterolemia     Past Surgical History  Procedure Laterality Date  . Tubal ligation  1995    after pregnancy with blighted ovum    Family History  Problem Relation Age of Onset  . Heart attack Father 6  . Heart disease Father   . Heart attack Mother   . Coronary artery disease Mother   . Diabetes Mother   . Heart disease Mother   .  Diabetes Brother   . Kidney cancer Brother   . Breast cancer Neg Hx   . Colon cancer Neg Hx   . Colon polyps Neg Hx   . Rectal cancer Neg Hx   . Stomach cancer Neg Hx     History   Social History  . Marital Status: Married    Spouse Name: N/A    Number of Children: 0  . Years of Education: N/A   Occupational History  . 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 Smoking status: Current Every Day Smoker -- 1.00 packs/day for 30 years    Types: Cigarettes  . Smokeless tobacco: Never Used  . Alcohol Use: 1.5 oz/week    3 drink(s) per week     Comment: 5th of liquor a week ([place])  . Drug Use: No  . Sexual Activity: Not on file   Other Topics Concern  . Not on file   Social History Narrative   Married since 1987; lives with husband   No children   Waitress at [employer]   No exercise    ROS:see history of present illness otherwise negative  BP 140/90 mmHg  Pulse 65  Ht 5\' 7"  (1.702 m)  Wt 227 lb (102.967 kg)  BMI 35.55 kg/m2  LMP 01/19/2013  PHYSICAL EXAM: Obese, in no  acute distress. Neck: No JVD, HJR, Bruit, or thyroid enlargement  Lungs: No tachypnea, clear without wheezing, rales, or rhonchi  Cardiovascular: RRR, PMI not displaced, Normal S1 and S2, positive S4,no murmurs, bruit, thrill, or heave.  Abdomen: BS normal. Soft without organomegaly, masses, lesions or tenderness.  Extremities: without cyanosis, clubbing or edema. Good distal pulses bilateral  SKin: Warm, no lesions or rashes   Musculoskeletal: No deformities  Neuro: no focal signs   Wt Readings from Last 3 Encounters:  03/06/14 227 lb (102.967 kg)  07/08/13 212 lb (96.163 kg)  05/26/13 218 lb 12 oz (99.224 kg)    Lab Results  Component Value Date   WBC 10.3 11/14/2008   HGB 13.2 11/14/2008   HCT 37.9 11/14/2008   PLT 184.0 11/14/2008   GLUCOSE 108* 03/30/2013   CHOL 183 12/20/2012   TRIG 81.0 12/20/2012   HDL 49.50 12/20/2012   LDLCALC 117* 12/20/2012   ALT  54* 12/20/2012   AST 47* 12/20/2012   NA 138 03/30/2013   K 3.7 03/30/2013   CL 104 03/30/2013   CREATININE 1.0 03/30/2013   BUN 20 03/30/2013   CO2 30 03/30/2013   TSH 1.77 09/11/2008    TRV:UYEBXI sinus rhythm, normal EKG

## 2014-04-06 ENCOUNTER — Other Ambulatory Visit: Payer: Self-pay | Admitting: Cardiology

## 2014-05-17 ENCOUNTER — Encounter: Payer: Self-pay | Admitting: Cardiology

## 2014-05-17 ENCOUNTER — Ambulatory Visit (INDEPENDENT_AMBULATORY_CARE_PROVIDER_SITE_OTHER): Payer: 59 | Admitting: Cardiology

## 2014-05-17 VITALS — BP 120/78 | HR 96 | Ht 66.0 in | Wt 215.0 lb

## 2014-05-17 DIAGNOSIS — E7800 Pure hypercholesterolemia, unspecified: Secondary | ICD-10-CM

## 2014-05-17 DIAGNOSIS — G4719 Other hypersomnia: Secondary | ICD-10-CM

## 2014-05-17 DIAGNOSIS — I779 Disorder of arteries and arterioles, unspecified: Secondary | ICD-10-CM

## 2014-05-17 DIAGNOSIS — I10 Essential (primary) hypertension: Secondary | ICD-10-CM

## 2014-05-17 DIAGNOSIS — G4733 Obstructive sleep apnea (adult) (pediatric): Secondary | ICD-10-CM

## 2014-05-17 LAB — TSH: TSH: 1.33 u[IU]/mL (ref 0.35–4.50)

## 2014-05-17 LAB — CBC WITH DIFFERENTIAL/PLATELET
Basophils Absolute: 0.1 10*3/uL (ref 0.0–0.1)
Basophils Relative: 0.4 % (ref 0.0–3.0)
EOS PCT: 1 % (ref 0.0–5.0)
Eosinophils Absolute: 0.1 10*3/uL (ref 0.0–0.7)
HEMATOCRIT: 46.2 % — AB (ref 36.0–46.0)
Hemoglobin: 15.3 g/dL — ABNORMAL HIGH (ref 12.0–15.0)
Lymphocytes Relative: 22.4 % (ref 12.0–46.0)
Lymphs Abs: 2.9 10*3/uL (ref 0.7–4.0)
MCHC: 33.1 g/dL (ref 30.0–36.0)
MCV: 90.5 fl (ref 78.0–100.0)
Monocytes Absolute: 0.5 10*3/uL (ref 0.1–1.0)
Monocytes Relative: 3.6 % (ref 3.0–12.0)
Neutro Abs: 9.5 10*3/uL — ABNORMAL HIGH (ref 1.4–7.7)
Neutrophils Relative %: 72.6 % (ref 43.0–77.0)
Platelets: 187 10*3/uL (ref 150.0–400.0)
RBC: 5.11 Mil/uL (ref 3.87–5.11)
RDW: 13.8 % (ref 11.5–15.5)
WBC: 13.1 10*3/uL — ABNORMAL HIGH (ref 4.0–10.5)

## 2014-05-17 LAB — BASIC METABOLIC PANEL
BUN: 13 mg/dL (ref 6–23)
CALCIUM: 9.8 mg/dL (ref 8.4–10.5)
CO2: 28 meq/L (ref 19–32)
CREATININE: 0.82 mg/dL (ref 0.40–1.20)
Chloride: 104 mEq/L (ref 96–112)
GFR: 77.9 mL/min (ref >60.00)
Glucose, Bld: 101 mg/dL — ABNORMAL HIGH (ref 70–99)
Potassium: 4.2 mEq/L (ref 3.5–5.1)
Sodium: 138 mEq/L (ref 135–145)

## 2014-05-17 LAB — LIPID PANEL
CHOL/HDL RATIO: 4
CHOLESTEROL: 191 mg/dL (ref 0–200)
HDL: 45.4 mg/dL (ref >39.00)
LDL CALC: 128 mg/dL — AB (ref 0–99)
NonHDL: 145.6
Triglycerides: 89 mg/dL (ref 0.0–149.0)
VLDL: 17.8 mg/dL (ref 0.0–40.0)

## 2014-05-17 NOTE — Progress Notes (Signed)
Patient ID: Nichole Burke, female   DOB: May 11, 1960, 54 y.o.   MRN: [id number] PCP: Dr. Damita Dunnings  54 yo with history of HTN and smoking presents for cardiology followup.  She had a stress echo in 7/12 for atypical chest pain that was normal.  MRA 11/14 showed a mildly dilated ascending aorta at 3.7 cm.    Lately, she has been stable symptomatically.  No chest pain. No exertional dyspnea.  No claudication.  She continues to smoke.  BP is under good control now.  Weight is down.  Main complaint is generalized fatigue and sleepiness during the day.  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  She snores loudly.    Labs (9/11): LDL 122 Labs (8/14): K 4.1, creatinine 0.8, AST 47, ALT 54, LDL 117, HDL 50  PMH: 1. Active smoker 2. HTN 3. Fatty liver with mildly increased LFTs.  Suspect may be from ETOH.  4. Dilation ascending aorta: 3.7 cm on chest CT in 6/12. MRA (11/14) with 3.7 cm ascending aorta.  5. Atypical chest pain.  6. Depression 7. Chest pain: Stress echo in 7/12 was normal.   SH: Married, Educational psychologist at [employer], lives in [place], drinks 1 fifth liquor/week, smokes about 1 ppd.   FH: Father with MI at 34, Mother with MI at 31  ROS: All systems reviewed and negative except as per HPI.   Current Outpatient Prescriptions  Medication Sig Dispense Refill  . aspirin 81 MG tablet Take 81 mg by mouth daily.      Marland Kitchen lisinopril (PRINIVIL,ZESTRIL) 20 MG tablet Take 1 tablet (20 mg total) by mouth daily. 30 tablet 6  . metoprolol succinate (TOPROL-XL) 25 MG 24 hr tablet TAKE 1 TABLET (25 MG TOTAL) BY MOUTH DAILY. 90 tablet 1  . Multiple Vitamin (MULTIVITAMIN) tablet Take 1 tablet by mouth daily.    . sertraline (ZOLOFT) 100 MG tablet TAKE ONE-HALF TABLETS BY MOUTH ONCE DAILY 45 tablet 3   No current facility-administered medications for this visit.    BP 120/78 mmHg  Pulse 96  Ht 5\' 6"  (1.676 m)  Wt 215 lb (97.523 kg)  BMI 34.72 kg/m2  LMP 01/19/2013 General: NAD,  overweight Neck: Thick, no JVD, no thyromegaly or thyroid nodule.  Lungs: mildly prolonged expiratory phase CV: Nondisplaced PMI.  Heart regular S1/S2, no S3/S4, no murmur.  No edema.  No carotid bruit.  Normal pedal pulses.  Abdomen: Soft, nontender, no hepatosplenomegaly, no distention.  Neurologic: Alert and oriented x 3.  Psych: Normal affect. Extremities: No clubbing or cyanosis.   Assessment/Plan: 1. Atypical chest pain: Likely noncardiac, none recently.  She had a negative stress echo in 7/12.  2. HTN: BP under better control.   3. Dilated ascending aorta: Ascending aorta measured 3.7 cm on last MRA chest in 11/14.  Keep BP controlled, consider repeat MRA in 3-4 years.  4. Hyperlipidemia: She is not on a statin. She does have a strong family history of premature CAD.  I Check lipids today.  5. Smoking: I strongly encouraged her to quit.  This is especially dangerous given her family history of CAD.  6. OSA: I think that OSA may be a contributor to her fatigue.  I will arrange for sleep study.  7. Fatigue: See above, to get sleep study. Will also check TSH and CBC.   Loralie Champagne 05/17/2014

## 2014-05-17 NOTE — Patient Instructions (Signed)
Your physician recommends that you have  a FASTING lipid profile/BMET/CBCd/TSH.  Your physician has recommended that you have a sleep study. This test records several body functions during sleep, including: brain activity, eye movement, oxygen and carbon dioxide blood levels, heart rate and rhythm, breathing rate and rhythm, the flow of air through your mouth and nose, snoring, body muscle movements, and chest and belly movement.  Your physician wants you to follow-up in: 1 year with Dr Aundra Dubin. (January 2017).  You will receive a reminder letter in the mail two months in advance. If you don't receive a letter, please call our office to schedule the follow-up appointment.

## 2014-05-23 ENCOUNTER — Telehealth: Payer: Self-pay | Admitting: Cardiology

## 2014-05-23 NOTE — Telephone Encounter (Signed)
Discussed recent lab results with patient.  

## 2014-05-23 NOTE — Telephone Encounter (Signed)
New Message  Pt returning Anne's phone call. Please call back and discuss.

## 2014-06-07 ENCOUNTER — Encounter: Payer: Self-pay | Admitting: Family Medicine

## 2014-06-07 ENCOUNTER — Ambulatory Visit (INDEPENDENT_AMBULATORY_CARE_PROVIDER_SITE_OTHER): Payer: 59 | Admitting: Family Medicine

## 2014-06-07 VITALS — BP 136/88 | HR 75 | Temp 98.6°F | Ht 66.0 in | Wt 223.0 lb

## 2014-06-07 DIAGNOSIS — G5601 Carpal tunnel syndrome, right upper limb: Secondary | ICD-10-CM

## 2014-06-07 DIAGNOSIS — M1810 Unilateral primary osteoarthritis of first carpometacarpal joint, unspecified hand: Secondary | ICD-10-CM

## 2014-06-07 MED ORDER — METHYLPREDNISOLONE ACETATE 40 MG/ML IJ SUSP
20.0000 mg | Freq: Once | INTRAMUSCULAR | Status: AC
  Administered 2014-06-07: 20 mg via INTRA_ARTICULAR

## 2014-06-07 NOTE — Addendum Note (Signed)
Addended by: Carter Kitten on: 06/07/2014 03:22 PM   Modules accepted: Orders

## 2014-06-07 NOTE — Progress Notes (Signed)
Pre visit review using our clinic review tool, if applicable. No additional management support is needed unless otherwise documented below in the visit note. 

## 2014-06-07 NOTE — Progress Notes (Signed)
Dr. Frederico Hamman T. Manasseh Pittsley, MD, [facility] [place], [address] Phone: [phone] Fax: [phone]  06/07/2014  Patient: Nichole Burke, MRN: [id number], DOB: May 19, 1960, 54 y.o.  Primary Physician:  Elsie Stain, MD  Chief Complaint: Hand Pain and Numbness  Subjective:   Nichole Burke is a 54 y.o. very pleasant female patient who presents with the following:  R CTS: CTS: The patent presents a 6 mo year history of 54 numbness and tingling, greatest in medial aspect of hands, some in ulnar aspect. Some weakness with grip strength. Shome occ. pain going to forearm. Bothers the most at night. Aggravated by lifting arms above head and at night.   Splints? NO Prior Injecton: none EMG: none Hand of Dominance: R   Fingers gets numb with driving and hurting all the way around the wrist and around the thumb.   6 mo.  Past Medical History, Surgical History, Social History, Family History, Problem List, Medications, and Allergies have been reviewed and updated if relevant.  GEN: No fevers, chills. Nontoxic. Primarily MSK c/o today. MSK: Detailed in the HPI GI: tolerating PO intake without difficulty Neuro: No numbness, parasthesias, or tingling associated. Otherwise the pertinent positives of the ROS are noted above.   Objective:   BP 136/88 mmHg  Pulse 75  Temp(Src) 98.6 F (37 C) (Oral)  Ht 5\' 6"  (1.676 m)  Wt 223 lb (101.152 kg)  BMI 36.01 kg/m2  LMP 01/19/2013   GEN: WDWN, NAD, Non-toxic, Alert & Oriented x 3 HEENT: Atraumatic, Normocephalic.  Ears and Nose: No external deformity. EXTR: No clubbing/cyanosis/edema NEURO: Normal gait.  PSYCH: Normally interactive. Conversant. Not depressed or anxious appearing.  Calm demeanor.   Hand: R Ecchymosis or edema: neg ROM wrist/hand/digits/elbow: full  Carpals, MCP's, digits: NT Distal Ulna and Radius: NT Supination lift test: neg Ecchymosis or edema:  neg Cysts/nodules: neg Finkelstein's test: neg Snuffbox tenderness: neg PAIN AT [facility] WITH COMPRESSION AND LOADING Scaphoid tubercle: NT Hook of Hamate: NT Resisted supination: NT Full composite fist Grip, all digits: 5/5 str on L, 4/5 on R No tenosynovitis Axial load test: neg Phalen's: + Tinel's: + Atrophy: mild on R  Hand sensation: intact   Radiology: No results found.  Assessment and Plan:   Carpal tunnel syndrome, right  Primary osteoarthritis of first carpometacarpal joint of one hand  Carpal Tunnel Syndrome: We discussed the anatomy involved, and that carpal tunnel syndrome primarily involves the median nerve, and this typically affects digits one through 3. We also discussed that mild cases of carpal tunnel syndrome are often improved with night splints, and it is very reasonable to consider a carpal tunnel injection. If the patient does have moderate to severe carpal tunnel syndrome based on NCV, then it is certainly reasonable to consider carpal tunnel release, which was discussed with the patient. We also discussed his severe carpal tunnel syndrome can lead to permanent nerve impairment even if released. At this point, the patient like to proceed conservatively.   Likely OA at [facility] contributing, also. Tylenol and NSAIDS, prn.  CTS Injection, R Verbal consent was obtained. Risks (including rare risk of infection), benefits, and alternatives were discussed. Prepped with Chloraprep and Ethyl Chloride used for anesthesia. Under sterile conditions,  the patient was injected just ulnar to the palmaris longus tendon at the wrist flexion crease.  The needle was inserted at 45 degree angle aiming distally. Aspiration showed no blood. Medication flowed freely without resistance.  Needle size: 22 gauge 1 1/2 inch Injection: 1/2 cc of Lidocaine 1% and Depo-Medrol 20 mg   Signed,  Jet Traynham T. Kleo Dungee, MD   Patient's Medications  New Prescriptions   No medications on file   Previous Medications   ASPIRIN 81 MG TABLET    Take 81 mg by mouth daily.     LISINOPRIL (PRINIVIL,ZESTRIL) 20 MG TABLET    Take 1 tablet (20 mg total) by mouth daily.   METOPROLOL SUCCINATE (TOPROL-XL) 25 MG 24 HR TABLET    TAKE 1 TABLET (25 MG TOTAL) BY MOUTH DAILY.   MULTIPLE VITAMIN (MULTIVITAMIN) TABLET    Take 1 tablet by mouth daily.   SERTRALINE (ZOLOFT) 100 MG TABLET    TAKE ONE-HALF TABLETS BY MOUTH ONCE DAILY  Modified Medications   No medications on file  Discontinued Medications   No medications on file

## 2014-07-12 ENCOUNTER — Other Ambulatory Visit: Payer: Self-pay | Admitting: Family Medicine

## 2014-07-12 ENCOUNTER — Telehealth: Payer: Self-pay

## 2014-07-12 NOTE — Telephone Encounter (Signed)
Patient aware she needs to make an appointment she will call back to schedule.

## 2014-07-12 NOTE — Telephone Encounter (Signed)
zoloft last filled 06/10/13 #45 w/ 3 refills--last OV with you CPE 05/26/13 no upcoming appts--please advise

## 2014-07-12 NOTE — Telephone Encounter (Signed)
Sent.  Schedule CPE.  Thanks.  

## 2014-07-18 ENCOUNTER — Telehealth: Payer: Self-pay | Admitting: Cardiology

## 2014-07-18 NOTE — Telephone Encounter (Signed)
Pt called to check on precert for sleep study on 07-25-14.  Precert was not notified of scheduled test.  Precert in progress.  Pt asked questions for Epworth Sleepiness Scale.  Score = 20.  Pt states does snore as witnessed by her husband and she has awoken herself by her snoring.  No known episodes of waking up gasping for air.

## 2014-07-24 ENCOUNTER — Other Ambulatory Visit: Payer: Self-pay | Admitting: *Deleted

## 2014-07-24 DIAGNOSIS — G4719 Other hypersomnia: Secondary | ICD-10-CM

## 2014-07-25 ENCOUNTER — Encounter (HOSPITAL_BASED_OUTPATIENT_CLINIC_OR_DEPARTMENT_OTHER): Payer: 59

## 2014-07-31 ENCOUNTER — Telehealth: Payer: Self-pay | Admitting: Cardiology

## 2014-07-31 NOTE — Telephone Encounter (Signed)
Spoke w/pt she has been authorized by her insurance company [employer], [id number] for home sleep study.  Tia, [employer] (sleep study company for [employer]) sent a message to [employer] (home sleep monitor company) to contact pt for instruction on home sleep study.  Pt given [employer]'s phone#[phone] if they don't contact her by Wednesday, Aug 02, 2014 so she can call them.  Pt agrees and understands

## 2014-09-03 ENCOUNTER — Other Ambulatory Visit: Payer: Self-pay | Admitting: Family Medicine

## 2014-09-03 DIAGNOSIS — R739 Hyperglycemia, unspecified: Secondary | ICD-10-CM

## 2014-09-04 ENCOUNTER — Other Ambulatory Visit (INDEPENDENT_AMBULATORY_CARE_PROVIDER_SITE_OTHER): Payer: 59

## 2014-09-04 DIAGNOSIS — R739 Hyperglycemia, unspecified: Secondary | ICD-10-CM

## 2014-09-04 LAB — GLUCOSE, RANDOM: Glucose, Bld: 101 mg/dL — ABNORMAL HIGH (ref 70–99)

## 2014-09-04 LAB — HEMOGLOBIN A1C: Hgb A1c MFr Bld: 5.8 % (ref 4.6–6.5)

## 2014-09-19 ENCOUNTER — Encounter: Payer: Self-pay | Admitting: Family Medicine

## 2014-09-19 ENCOUNTER — Ambulatory Visit (INDEPENDENT_AMBULATORY_CARE_PROVIDER_SITE_OTHER): Payer: 59 | Admitting: Family Medicine

## 2014-09-19 VITALS — BP 124/70 | HR 68 | Temp 98.7°F | Ht 66.0 in | Wt 221.0 lb

## 2014-09-19 DIAGNOSIS — I10 Essential (primary) hypertension: Secondary | ICD-10-CM | POA: Diagnosis not present

## 2014-09-19 DIAGNOSIS — R739 Hyperglycemia, unspecified: Secondary | ICD-10-CM

## 2014-09-19 DIAGNOSIS — G4733 Obstructive sleep apnea (adult) (pediatric): Secondary | ICD-10-CM

## 2014-09-19 DIAGNOSIS — J209 Acute bronchitis, unspecified: Secondary | ICD-10-CM

## 2014-09-19 DIAGNOSIS — M545 Low back pain: Secondary | ICD-10-CM | POA: Diagnosis not present

## 2014-09-19 DIAGNOSIS — F1099 Alcohol use, unspecified with unspecified alcohol-induced disorder: Secondary | ICD-10-CM | POA: Diagnosis not present

## 2014-09-19 DIAGNOSIS — Z7289 Other problems related to lifestyle: Secondary | ICD-10-CM

## 2014-09-19 DIAGNOSIS — Z0000 Encounter for general adult medical examination without abnormal findings: Secondary | ICD-10-CM

## 2014-09-19 DIAGNOSIS — Z789 Other specified health status: Secondary | ICD-10-CM

## 2014-09-19 MED ORDER — SERTRALINE HCL 100 MG PO TABS: 50.0000 mg | ORAL_TABLET | Freq: Every day | ORAL | Status: DC

## 2014-09-19 MED ORDER — LISINOPRIL 20 MG PO TABS: 20.0000 mg | ORAL_TABLET | Freq: Every day | ORAL | Status: DC

## 2014-09-19 MED ORDER — METOPROLOL SUCCINATE ER 25 MG PO TB24: ORAL_TABLET | ORAL | Status: DC

## 2014-09-19 NOTE — Patient Instructions (Addendum)
You can call for a mammogram at these locations:  [facility] at [facility].  [place] [facility] [place] [facility] [place] [phone] --------------------------------------- Use the eat right diet to try to get some weight off gradually.  Cut back on bread.  Don't change your meds otherwise.  Gently stretch your back.  Use heat.  Limit aleve.  Take care.  Glad to see you.

## 2014-09-19 NOTE — Progress Notes (Signed)
Pre visit review using our clinic review tool, if applicable. No additional management support is needed unless otherwise documented below in the visit note.  CPE- See plan.  Routine anticipatory guidance given to patient.  See health maintenance. Tetanus 2013 Flu shot- encouraged Tobacco and etoh discussed, precontemplative on both.  Living will, encouraged. Husband designated if incapacitated.  Mammogram due. She'll call about this.  Pap 2014 Colonoscopy 2014  Diet and exercise- d/w pt. Encouraged both.   Hypertension:    Using medication without problems or lightheadedness: yes Chest pain with exertion:no Edema:no Short of breath: no  Cough.  Going on for 2+ weeks.  Taking mucinex.  No fevers. Still smoking.  She doesn't feel poorly.  Some sputum, yellow.  Some wheeze.   Back pain.  Lower back pain.  No leg pain.  Midline, just able the belt line.  Muscle tightness noted B by patient.  No treatment yet, other than laying on the floor with some help and one dose of aleve.   Aleve helped, d/w pt about limiting that.    PMH and SH reviewed  Meds, vitals, and allergies reviewed.   ROS: See HPI.  Otherwise negative.    GEN: nad, alert and oriented HEENT: mucous membranes moist NECK: supple w/o LA CV: rrr. PULM: coarse BS with cough and scattered rhonchi B, no inc wob, no wheeze ABD: soft, +bs EXT: no edema SKIN: no acute rash Back exam:  Not ttp but her painful area is midline, just able the belt line.  Muscle tightness in paraspinal muscles noted B. SLR neg.   Distally S/S wnl

## 2014-09-20 ENCOUNTER — Telehealth: Payer: Self-pay

## 2014-09-20 ENCOUNTER — Telehealth: Payer: Self-pay | Admitting: Family Medicine

## 2014-09-20 DIAGNOSIS — R739 Hyperglycemia, unspecified: Secondary | ICD-10-CM | POA: Insufficient documentation

## 2014-09-20 MED ORDER — ALBUTEROL SULFATE HFA 108 (90 BASE) MCG/ACT IN AERS: 2.0000 | INHALATION_SPRAY | Freq: Four times a day (QID) | RESPIRATORY_TRACT | Status: DC | PRN

## 2014-09-20 MED ORDER — DOXYCYCLINE HYCLATE 100 MG PO TABS: 100.0000 mg | ORAL_TABLET | Freq: Two times a day (BID) | ORAL | Status: DC

## 2014-09-20 NOTE — Assessment & Plan Note (Signed)
Encouraged dec in use.

## 2014-09-20 NOTE — Assessment & Plan Note (Signed)
Will check on prev sleep study results.

## 2014-09-20 NOTE — Telephone Encounter (Signed)
Doxy and inhaler sent.  Thanks.  I thought it was sent prev.  See prev note about the skin lesion behind her ear.

## 2014-09-20 NOTE — Assessment & Plan Note (Signed)
Diet and exercise- d/w pt. Encouraged both.  Handout given re: hyperglycemia.  Labs d/w pt.

## 2014-09-20 NOTE — Telephone Encounter (Signed)
Pt said abx was not at pharmacy; 09/19/14 office note had start doxycycline for acute bronchitis; pt request inhaler as well sent to [facility]. Pt request cb when sent to pharmacy.

## 2014-09-20 NOTE — Telephone Encounter (Signed)
Patient notified as instructed by telephone and verbalized understanding. Patient stated that the spot behind her ear comes and goes. Patient stated that she would like to have it evaluated and removed if possible. Appointment scheduled for 30 minutes on 09/29/14.

## 2014-09-20 NOTE — Telephone Encounter (Signed)
Pt left v/m requesting status of refills and request cb.

## 2014-09-20 NOTE — Assessment & Plan Note (Signed)
Routine anticipatory guidance given to patient.  See health maintenance. Tetanus 2013 Flu shot- encouraged Tobacco and etoh discussed, precontemplative on both.  Living will, encouraged. Husband designated if incapacitated.  Mammogram due. She'll call about this.  Pap 2014 Colonoscopy 2014  Diet and exercise- d/w pt. Encouraged both.  Handout given re: hyperglycemia.  Labs d/w pt.

## 2014-09-20 NOTE — Assessment & Plan Note (Signed)
Likely benign MSK source, use aleve sparingly, stretch and use heat.  F/u prn.

## 2014-09-20 NOTE — Telephone Encounter (Signed)
Patient notified as instructed by telephone and verbalized understanding. Appointment scheduled for evaluation of skin lesion.

## 2014-09-20 NOTE — Telephone Encounter (Signed)
Call pt.  She had a skin lesion behind the R ear last year.  She was going to get is shaved off at a follow up appointment.  She didn't come back until yesterday and I didn't look for that lesion specifically.  Did it go away?  Does she still have it?  If needed eval and if still present, then I need to get her rescheduled for possible removal.  I'm checking on her sleep study in the meantime.   Thanks.

## 2014-09-20 NOTE — Assessment & Plan Note (Signed)
Controlled, needs weight loss and smoking cessation.  D/w pt.

## 2014-09-20 NOTE — Telephone Encounter (Signed)
Noted, thanks!

## 2014-09-20 NOTE — Assessment & Plan Note (Signed)
Encouraged smoking cessation.  Start doxy.  Fu prn.  She agrees. Still okay for outpatient f/u.

## 2014-09-25 ENCOUNTER — Telehealth: Payer: Self-pay | Admitting: *Deleted

## 2014-09-25 NOTE — Telephone Encounter (Signed)
I spoke with [facility]. Matt states pt had sleep study done 08/13/14 and report was faxed to [phone]. Matt advised that is no longer a valid fax number, Catalina Antigua given [phone], correct fax, Catalina Antigua states he will fax report, he is unable to give me verbal report over the telephone.

## 2014-09-25 NOTE — Telephone Encounter (Signed)
I received sleep study result done 08/13/14, Dr Aundra Dubin to review 09/28/14.

## 2014-09-25 NOTE — Telephone Encounter (Signed)
-----   Message from Larey Dresser, MD sent at 09/20/2014  3:43 PM EDT ----- Looks like she had a home sleep study ordered.  I will have my nurse check on this.   ----- Message -----    From: Tonia Ghent, MD    Sent: 09/20/2014  12:42 AM      To: Larey Dresser, MD  What happened with her sleep study?  I didn't see the results.  Thanks.   Brigitte Pulse

## 2014-09-26 ENCOUNTER — Encounter: Payer: Self-pay | Admitting: Cardiology

## 2014-09-28 ENCOUNTER — Other Ambulatory Visit: Payer: Self-pay | Admitting: *Deleted

## 2014-09-28 DIAGNOSIS — G4733 Obstructive sleep apnea (adult) (pediatric): Secondary | ICD-10-CM

## 2014-09-28 NOTE — Telephone Encounter (Signed)
Dr Aundra Dubin reviewed sleep study  Significant sleep apnea, recommend formal CPAP titration.  Pt is scheduled for CPAP titration at [facility] August 5,2016 at [facility]   --pt has been placed on cancellation list in case they can add her sooner. --they are mailing a packet of information with the address info. --phone number to sleep lab should she need to cancel or reschedule is [phone]. --Call Bethany at [phone] with questions or concerns.  LMTCB for pt to give her results of sleep study and this information.

## 2014-09-29 ENCOUNTER — Encounter: Payer: Self-pay | Admitting: Family Medicine

## 2014-09-29 ENCOUNTER — Ambulatory Visit (INDEPENDENT_AMBULATORY_CARE_PROVIDER_SITE_OTHER): Payer: 59 | Admitting: Family Medicine

## 2014-09-29 VITALS — BP 132/78 | HR 81 | Temp 98.9°F | Wt 225.2 lb

## 2014-09-29 DIAGNOSIS — R1310 Dysphagia, unspecified: Secondary | ICD-10-CM | POA: Diagnosis not present

## 2014-09-29 DIAGNOSIS — L989 Disorder of the skin and subcutaneous tissue, unspecified: Secondary | ICD-10-CM | POA: Diagnosis not present

## 2014-09-29 DIAGNOSIS — G4733 Obstructive sleep apnea (adult) (pediatric): Secondary | ICD-10-CM | POA: Diagnosis not present

## 2014-09-29 NOTE — Progress Notes (Signed)
Pre visit review using our clinic review tool, if applicable. No additional management support is needed unless otherwise documented below in the visit note.  F/u for treatment of lesion behind ear.  Not painful, no enlargement.  Present for at least 1 year.   Also with frequent dysphagia, sensation of food sticking in the mid chest.  No OP sx.  D/w pt.  She agrees to referral to GI.  Not vomiting blood.  Is usually able to clear her esophagus, no emergent events prev.   Meds, vitals, and allergies reviewed.   ROS: See HPI.  Otherwise, noncontributory.  nad Lesion posterior to R pinna, not ulcerated.  Looks to be an irritated SK.  D/w pt about options.

## 2014-09-29 NOTE — Patient Instructions (Addendum)
Call cardiology about the sleep apnea arrangements.   Nichole Burke will call about your referral to GI.  Keep the area clean.  It will likely blister and then slough off.  Keep it covered if needed.  You can gently wash it with soap and water.   Take care. Glad to see you.

## 2014-10-02 DIAGNOSIS — R1310 Dysphagia, unspecified: Secondary | ICD-10-CM | POA: Insufficient documentation

## 2014-10-02 NOTE — Assessment & Plan Note (Signed)
She'll call about f/u with cards.

## 2014-10-02 NOTE — Assessment & Plan Note (Signed)
Since small, okay to freeze with liq N2.  D/w pt about options.  She agrees with freezing, done x3, tolerated well.  No complications.  See AVS.

## 2014-10-02 NOTE — Assessment & Plan Note (Signed)
Refer to GI 

## 2014-10-03 ENCOUNTER — Encounter: Payer: Self-pay | Admitting: Internal Medicine

## 2014-10-03 NOTE — Telephone Encounter (Signed)
LMTCB

## 2014-10-03 NOTE — Telephone Encounter (Signed)
Pt advised, verbalized understanding, agreed with CPAP titration.

## 2014-10-16 ENCOUNTER — Other Ambulatory Visit: Payer: Self-pay | Admitting: Physician Assistant

## 2014-10-18 ENCOUNTER — Other Ambulatory Visit: Payer: Self-pay | Admitting: Physician Assistant

## 2014-11-21 ENCOUNTER — Encounter: Payer: Self-pay | Admitting: *Deleted

## 2014-11-21 ENCOUNTER — Encounter: Payer: Self-pay | Admitting: Family Medicine

## 2014-11-21 ENCOUNTER — Telehealth: Payer: Self-pay | Admitting: Family Medicine

## 2014-11-21 ENCOUNTER — Ambulatory Visit (INDEPENDENT_AMBULATORY_CARE_PROVIDER_SITE_OTHER): Payer: 59 | Admitting: Family Medicine

## 2014-11-21 VITALS — BP 110/68 | HR 72 | Temp 98.7°F | Ht 66.0 in | Wt 219.8 lb

## 2014-11-21 DIAGNOSIS — M549 Dorsalgia, unspecified: Secondary | ICD-10-CM

## 2014-11-21 DIAGNOSIS — G8929 Other chronic pain: Secondary | ICD-10-CM

## 2014-11-21 DIAGNOSIS — M545 Low back pain, unspecified: Secondary | ICD-10-CM | POA: Insufficient documentation

## 2014-11-21 DIAGNOSIS — M546 Pain in thoracic spine: Secondary | ICD-10-CM

## 2014-11-21 LAB — POCT URINALYSIS DIPSTICK
Bilirubin, UA: NEGATIVE
Blood, UA: NEGATIVE
GLUCOSE UA: NEGATIVE
KETONES UA: NEGATIVE
Leukocytes, UA: NEGATIVE
Nitrite, UA: NEGATIVE
PH UA: 6.5
Protein, UA: 15
Spec Grav, UA: 1.015
Urobilinogen, UA: 0.2

## 2014-11-21 MED ORDER — DICLOFENAC SODIUM 75 MG PO TBEC: 75.0000 mg | DELAYED_RELEASE_TABLET | Freq: Two times a day (BID) | ORAL | Status: DC

## 2014-11-21 MED ORDER — CYCLOBENZAPRINE HCL 10 MG PO TABS: 10.0000 mg | ORAL_TABLET | Freq: Every evening | ORAL | Status: DC | PRN

## 2014-11-21 NOTE — Telephone Encounter (Signed)
Pt called , has not received the medications at her pharmacy for today's visit.  [facility].  Best number to call pt if needed is [phone]

## 2014-11-21 NOTE — Addendum Note (Signed)
Addended by: Emelia Salisbury C on: 11/21/2014 10:08 AM   Modules accepted: Orders

## 2014-11-21 NOTE — Telephone Encounter (Signed)
Please apologize to pt. Meds have been sent in.

## 2014-11-21 NOTE — Progress Notes (Signed)
Pre visit review using our clinic review tool, if applicable. No additional management support is needed unless otherwise documented below in the visit note. 

## 2014-11-21 NOTE — Progress Notes (Signed)
Subjective:    Patient ID: Nichole Burke, female    DOB: May 30, 1960, 54 y.o.   MRN: [id number]  HPI   54 year old femal  pt of Dr. Josefine Class presents with intermittent history of  low back pain over the last  6-7 months.  She has daily soreness after doing activity in low back. Worse after bending, standing longer time.  Radiate up to mid back, no radiation down legs. No weakness, no numbness in legs. No fever, no incontinence.   In last week she has had new sharper pain in right mid back . 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 No dysuria. No new  urgency or hematuria.  No known fall, MVA or injury. No change in activity.  Has used aleve at times in past but told not to take it for ankle swelling.   Social History /Family History/Past Medical History reviewed and updated if needed. No history of back problems or surgery.  Review of Systems  Constitutional: Negative for fever and fatigue.  HENT: Negative for ear pain.   Eyes: Negative for pain.  Respiratory: Negative for chest tightness and shortness of breath.   Cardiovascular: Negative for chest pain, palpitations and leg swelling.  Gastrointestinal: Negative for abdominal pain.  Genitourinary: Negative for dysuria.       Objective:   Physical Exam  Constitutional: Vital signs are normal. She appears well-developed and well-nourished. She is cooperative.  Non-toxic appearance. She does not appear ill. No distress.  HENT:  Head: Normocephalic.  Right Ear: Hearing, tympanic membrane, external ear and ear canal normal. Tympanic membrane is not erythematous, not retracted and not bulging.  Left Ear: Hearing, tympanic membrane, external ear and ear canal normal. Tympanic membrane is not erythematous, not retracted and not bulging.  Nose: No mucosal edema or rhinorrhea. Right sinus exhibits no maxillary sinus tenderness and no frontal sinus tenderness. Left sinus exhibits no maxillary sinus tenderness  and no frontal sinus tenderness.  Mouth/Throat: Uvula is midline, oropharynx is clear and moist and mucous membranes are normal.  Eyes: Conjunctivae, EOM and lids are normal. Pupils are equal, round, and reactive to light. Lids are everted and swept, no foreign bodies found.  Neck: Trachea normal and normal range of motion. Neck supple. Carotid bruit is not present. No thyroid mass and no thyromegaly present.  Cardiovascular: Normal rate, regular rhythm, S1 normal, S2 normal, normal heart sounds, intact distal pulses and normal pulses.  Exam reveals no gallop and no friction rub.   No murmur heard. Pulmonary/Chest: Effort normal and breath sounds normal. No tachypnea. No respiratory distress. She has no decreased breath sounds. She has no wheezes. She has no rhonchi. She has no rales.  Abdominal: Soft. Normal appearance and bowel sounds are normal. There is no tenderness.  Musculoskeletal:       Thoracic back: She exhibits tenderness. She exhibits normal range of motion and no bony tenderness.       Lumbar back: She exhibits tenderness and bony tenderness. She exhibits normal range of motion and no swelling.  Neg SLR , neg faber's  Neurological: She is alert. She has normal strength. No cranial nerve deficit or sensory deficit. She exhibits normal muscle tone. She displays a negative Romberg sign. Coordination and gait normal.  Skin: Skin is warm, dry and intact. No rash noted.  Psychiatric: Her speech is normal and behavior is normal. Judgment and thought content normal. Her mood appears not anxious. Cognition and  memory are normal. She does not exhibit a depressed mood.          Assessment & Plan:

## 2014-11-21 NOTE — Assessment & Plan Note (Signed)
NSAIDS, recommended strengthening core muscle, weight loss and regular exercise. to prevent continued flares of low back pain.

## 2014-11-21 NOTE — Assessment & Plan Note (Signed)
Eval with UA.  Most likely strain of paraspinous muscles.  Treat with NSAIDs, heat, muscle relaxant at night and start home PT. Info provided.

## 2014-11-21 NOTE — Patient Instructions (Signed)
Start diclofenac twice daily for pain and inflammation.  Use muscle relaxant at night.  Start home PT and heat on back.  Recommended strengthening core muscle, weight loss and regular exercise. to prevent continued flares of low back pain.

## 2014-11-21 NOTE — Telephone Encounter (Signed)
Ms. Yam notified prescriptions have been sent to her pharmacy.

## 2014-11-23 ENCOUNTER — Telehealth: Payer: Self-pay

## 2014-11-23 NOTE — Telephone Encounter (Signed)
Left a message for patient to return my call about having a Mammogram set up. Will await call back.  

## 2014-11-28 ENCOUNTER — Encounter: Payer: Self-pay | Admitting: *Deleted

## 2014-12-05 ENCOUNTER — Ambulatory Visit (INDEPENDENT_AMBULATORY_CARE_PROVIDER_SITE_OTHER): Payer: 59 | Admitting: Internal Medicine

## 2014-12-05 ENCOUNTER — Encounter: Payer: Self-pay | Admitting: Internal Medicine

## 2014-12-05 VITALS — BP 114/70 | HR 72 | Ht 66.0 in | Wt 225.6 lb

## 2014-12-05 DIAGNOSIS — R1013 Epigastric pain: Secondary | ICD-10-CM | POA: Diagnosis not present

## 2014-12-05 DIAGNOSIS — Z86010 Personal history of colonic polyps: Secondary | ICD-10-CM

## 2014-12-05 DIAGNOSIS — K5900 Constipation, unspecified: Secondary | ICD-10-CM

## 2014-12-05 DIAGNOSIS — K219 Gastro-esophageal reflux disease without esophagitis: Secondary | ICD-10-CM | POA: Diagnosis not present

## 2014-12-05 MED ORDER — PANTOPRAZOLE SODIUM 40 MG PO TBEC: 40.0000 mg | DELAYED_RELEASE_TABLET | Freq: Every day | ORAL | Status: DC

## 2014-12-05 NOTE — Progress Notes (Signed)
Patient ID: Nichole Burke, female   DOB: Jul 29, 1960, 54 y.o.   MRN: [id number] HPI: Nichole Burke is a 54 year old female known to me from colonoscopy with history of adenomatous polyp, tobacco use, hypertension and hypercholesterolemia who is seen for follow-up to evaluate cough and regurgitation. She is here alone today. She reports that she has been having cough and phlegm in her throat leading to frequent throat clearing. She also has water brash and regurgitation after meals. She is having heartburn also as well as indigestion she is using Tom's. She notes waking up with cough nocturnally and the coughing then leads to vomiting. She will often vomit whatever she had for dinner. She denies true dysphagia or odynophagia. She reports food does not stick with swallowing but rather is regurgitated. She notices nocturnal coughing at least one day per week. This is been going on for several months if not a few years. She denies abdominal pain. Bowel movements have been slightly slower than normal and she feels slightly constipated going every 2-3 days. No blood in her stool or melena.  Colonoscopy reviewed from 03/21/2013. This revealed a pedunculated adenomatous colon polyp which was removed with snare cautery. Repeat surveillance colonoscopy was recommended in 3 years  Family history is reviewed and negative for GI tract malignancy She does continue to smoke 1 pack of cigarettes per day She has an upcoming evaluation for OSA  Past Medical History  Diagnosis Date  . HTN (hypertension)   . Anxiety   . Plantar fasciitis     Right  . Tobacco abuse   . Pure hypercholesterolemia   . Tubular adenoma of colon     Past Surgical History  Procedure Laterality Date  . Tubal ligation  1995    after pregnancy with blighted ovum    Outpatient Prescriptions Prior to Visit  Medication Sig Dispense Refill  . aspirin 81 MG tablet Take 81 mg by mouth daily.      . cyclobenzaprine (FLEXERIL) 10 MG tablet  Take 1 tablet (10 mg total) by mouth at bedtime as needed for muscle spasms. 15 tablet 0  . lisinopril (PRINIVIL,ZESTRIL) 20 MG tablet Take 1 tablet (20 mg total) by mouth daily. 90 tablet 3  . metoprolol succinate (TOPROL-XL) 25 MG 24 hr tablet TAKE 1 TABLET (25 MG TOTAL) BY MOUTH DAILY. 90 tablet 3  . Multiple Vitamin (MULTIVITAMIN) tablet Take 1 tablet by mouth daily.    . sertraline (ZOLOFT) 100 MG tablet Take 0.5 tablets (50 mg total) by mouth daily. 45 tablet 3  . albuterol (PROVENTIL HFA;VENTOLIN HFA) 108 (90 BASE) MCG/ACT inhaler Inhale 2 puffs into the lungs every 6 (six) hours as needed for wheezing or shortness of breath. 1 Inhaler 0  . diclofenac (VOLTAREN) 75 MG EC tablet Take 1 tablet (75 mg total) by mouth 2 (two) times daily. 30 tablet 0   No facility-administered medications prior to visit.    No Known Allergies  Family History  Problem Relation Age of Onset  . Heart attack Father 55  . Heart disease Father   . Heart attack Mother   . Coronary artery disease Mother   . Diabetes Mother   . Heart disease Mother   . Diabetes Brother   . Kidney cancer Brother   . Breast cancer Neg Hx   . Colon cancer Neg Hx   . Colon polyps Neg Hx   . Rectal cancer Neg Hx   . Stomach cancer Neg Hx     History  Substance Use Topics  . Smoking status: Current Every Day Smoker -- 1.00 packs/day for 30 years    Types: Cigarettes  . Smokeless tobacco: Never Used  . Alcohol Use: 1.8 oz/week    3 Standard drinks or equivalent per week     Comment: 5th of liquor a week ([place])    ROS: As per history of present illness, otherwise negative  BP 114/70 mmHg  Pulse 72  Ht 5\' 6"  (1.676 m)  Wt 225 lb 9.6 oz (102.331 kg)  BMI 36.43 kg/m2  LMP 01/19/2013 Constitutional: Well-developed and well-nourished. No distress. HEENT: Normocephalic and atraumatic. Oropharynx is clear and moist. No oropharyngeal exudate. Conjunctivae are normal.  No scleral icterus. Neck: Neck supple. Trachea  midline. Cardiovascular: Normal rate, regular rhythm and intact distal pulses. No M/R/G Pulmonary/chest: Effort normal and breath sounds normal. Mild in Tory wheeze no rales or rhonchi Abdominal: Soft, nontender, nondistended. Bowel sounds active throughout.  Extremities: no clubbing, cyanosis, or edema Lymphadenopathy: No cervical adenopathy noted. Neurological: Alert and oriented to person place and time. Skin: Skin is warm and dry. No rashes noted. Psychiatric: Normal mood and affect. Behavior is normal.   ASSESSMENT/PLAN: 54 year old female known to me from colonoscopy with history of adenomatous polyp, tobacco use, hypertension and hypercholesterolemia who is seen for follow-up to evaluate cough and regurgitation.   1. GERD/regurgitation/dyspepsia -- symptoms are most consistent with uncontrolled reflux disease. Some atypical symptoms including cough and throat clearing. I recommended pantoprazole 40 mg once daily. GERD diet reviewed and recommended. If no improvement after 4-6 weeks upper endoscopy is recommended. She is asked to call my office in 6 weeks with an update. She voices understanding.  2. Mild constipation -- begin stool softener with Colace 100 mg 1-2 tablets at bedtime. Call if no improvement  3. History of adenomatous colon polyp -- repeat colonoscopy recommended 3 years from initial screening exam which for her would be November 2017  25 min spent with pt today    VW:[id number] Josefine Class, [place], [place] [id number]

## 2014-12-05 NOTE — Patient Instructions (Signed)
We have sent the following medications to your pharmacy for you to pick up at your convenience: Pantoprazole 40 mg daily  Please purchase the following medications over the counter and take as directed: Colace 100 mg 1-2 tablets daily  Call our office in 6 weeks with a follow up [phone]). If you are no better at that time, we may consider EGD.

## 2014-12-08 ENCOUNTER — Encounter (HOSPITAL_BASED_OUTPATIENT_CLINIC_OR_DEPARTMENT_OTHER): Payer: 59

## 2014-12-11 ENCOUNTER — Other Ambulatory Visit: Payer: Self-pay | Admitting: Family Medicine

## 2014-12-12 NOTE — Telephone Encounter (Signed)
Ok to refill 

## 2014-12-13 ENCOUNTER — Other Ambulatory Visit: Payer: Self-pay | Admitting: Family Medicine

## 2014-12-13 NOTE — Telephone Encounter (Signed)
Sent. Thanks.   

## 2014-12-19 ENCOUNTER — Encounter (HOSPITAL_BASED_OUTPATIENT_CLINIC_OR_DEPARTMENT_OTHER): Payer: 59

## 2015-01-04 ENCOUNTER — Ambulatory Visit (INDEPENDENT_AMBULATORY_CARE_PROVIDER_SITE_OTHER)
Admission: RE | Admit: 2015-01-04 | Discharge: 2015-01-04 | Disposition: A | Discharge status: Home / Self Care | Payer: 59 | Source: Ambulatory Visit | Attending: Family Medicine | Admitting: Family Medicine

## 2015-01-04 ENCOUNTER — Ambulatory Visit (INDEPENDENT_AMBULATORY_CARE_PROVIDER_SITE_OTHER): Payer: 59 | Admitting: Family Medicine

## 2015-01-04 ENCOUNTER — Encounter: Payer: Self-pay | Admitting: Family Medicine

## 2015-01-04 VITALS — BP 118/64 | HR 78 | Temp 97.9°F | Wt 229.0 lb

## 2015-01-04 DIAGNOSIS — M545 Low back pain, unspecified: Secondary | ICD-10-CM

## 2015-01-04 DIAGNOSIS — G8929 Other chronic pain: Secondary | ICD-10-CM | POA: Diagnosis not present

## 2015-01-04 DIAGNOSIS — R200 Anesthesia of skin: Secondary | ICD-10-CM | POA: Insufficient documentation

## 2015-01-04 DIAGNOSIS — R208 Other disturbances of skin sensation: Secondary | ICD-10-CM | POA: Diagnosis not present

## 2015-01-04 NOTE — Progress Notes (Signed)
Pre visit review using our clinic review tool, if applicable. No additional management support is needed unless otherwise documented below in the visit note.  Back pain.  Lower back pain.  "It's like I get a knot" in the lower midline pain that is sore, but she can't feel a lump.  Going on for months.  She couldn't tolerate flexeril- sedation.  Diclofenac didn't help much- rx'd from Dr. Diona Browner and her note was reviewed. Heat doesn't help.  More pain if leaning forward at the waist.  Less pain laying down.  No weakness in legs.  No B/B sx.  No trauma.  No rash.  Occ R thigh numbness, anterior thigh.   Still with baseline etoh, no change.    New L arm numbness with head rotation, noted recently, episodic sx.  No R arm sx.  No neck or arm rash.  No trauma.   Meds, vitals, and allergies reviewed.   ROS: See HPI.  Otherwise, noncontributory.  nad ncat Neck supple, no LA, no midline pain.  Some discomfort with ROM with paraspinal muscle stretching in the neck. Normal S/S BUE Normal L shoulder elbow and hand rom rrr ctab Back with lower midline tenderness w/o rash or bruising.  Normal S/S BLE SLR neg w/o radicular sx.   Able to bear weight.   C and L spine films pending.

## 2015-01-04 NOTE — Assessment & Plan Note (Signed)
Check plain films today.  Aleve likely the safest option re: pain med for now.  We'll make plans when I see her films.  She agrees.  See AVS.  Continue PPI, esp with nsaid use.

## 2015-01-04 NOTE — Patient Instructions (Addendum)
If your GERD isn't better, then call GI about follow up.   Aleve may make the heartburn worse but it is likely the best option for now.  Go to the lab on the way out.  We'll contact you with your xray report. We'll go from there.

## 2015-01-04 NOTE — Assessment & Plan Note (Signed)
No weakness x4 ext, okay for outpatient f/u.  Likely C spine source.  Will check plain films and then update patient.  See discussion re: low back pain and meds.

## 2015-01-07 ENCOUNTER — Other Ambulatory Visit: Payer: Self-pay | Admitting: Family Medicine

## 2015-01-07 DIAGNOSIS — M545 Low back pain: Secondary | ICD-10-CM

## 2015-01-07 DIAGNOSIS — R200 Anesthesia of skin: Secondary | ICD-10-CM

## 2015-01-11 ENCOUNTER — Telehealth: Payer: Self-pay | Admitting: *Deleted

## 2015-02-01 NOTE — Telephone Encounter (Signed)
Encounter opened in error

## 2015-03-13 ENCOUNTER — Other Ambulatory Visit: Payer: Self-pay | Admitting: Specialist

## 2015-03-13 DIAGNOSIS — IMO0002 Reserved for concepts with insufficient information to code with codable children: Secondary | ICD-10-CM

## 2015-03-13 DIAGNOSIS — R229 Localized swelling, mass and lump, unspecified: Principal | ICD-10-CM

## 2015-03-14 ENCOUNTER — Ambulatory Visit
Admission: RE | Admit: 2015-03-14 | Discharge: 2015-03-14 | Disposition: A | Discharge status: Home / Self Care | Payer: 59 | Source: Ambulatory Visit | Attending: Specialist | Admitting: Specialist

## 2015-03-14 DIAGNOSIS — R229 Localized swelling, mass and lump, unspecified: Principal | ICD-10-CM

## 2015-03-14 DIAGNOSIS — IMO0002 Reserved for concepts with insufficient information to code with codable children: Secondary | ICD-10-CM

## 2015-03-14 MED ORDER — IOPAMIDOL (ISOVUE-300) INJECTION 61%
100.0000 mL | Freq: Once | INTRAVENOUS | Status: AC | PRN
  Administered 2015-03-14: 100 mL via INTRAVENOUS

## 2015-03-14 MED ORDER — IOHEXOL 300 MG/ML  SOLN
30.0000 mL | Freq: Once | INTRAMUSCULAR | Status: AC | PRN
  Administered 2015-03-14: 30 mL via ORAL

## 2015-03-14 MED ORDER — IOHEXOL 300 MG/ML  SOLN
30.0000 mL | Freq: Once | INTRAMUSCULAR | Status: DC | PRN
Start: 2015-03-14 — End: 2015-03-14

## 2015-04-04 ENCOUNTER — Telehealth: Payer: Self-pay

## 2015-04-04 NOTE — Telephone Encounter (Signed)
Pt left v/m pt was seen by orthopedic dr for back issues; pt had MRI of back; ortho felt cyst on ovaries and order CT of pelvis; pt wants Dr Damita Dunnings to review the pelvic CT scan and call pt with his recommendations.

## 2015-04-05 NOTE — Telephone Encounter (Signed)
I sent myself a reminder in the EMR to schedule the f/u scan in April of 2017.

## 2015-04-05 NOTE — Telephone Encounter (Signed)
Patient advised.   Patient says she sees Dr. Damita Dunnings for all of her care and would like for him to schedule follow up.  She will be due for a CPE in May.  Perhaps this could be scheduled prior to her CPE?

## 2015-04-05 NOTE — Telephone Encounter (Signed)
CT report reviewed. 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  This is the most likely dx, but to be sure it is reasonable to get f/u pelvic CT in 4-6 months assess for stability. No other evidence of a mass. No other significant finding, ie no enlarged lymph nodes.  I can order this if the other docs are not following it.  Have her let us know if we need to follow this.

## 2015-04-05 NOTE — Telephone Encounter (Signed)
Left message on patient's voicemail to return call

## 2015-07-25 ENCOUNTER — Ambulatory Visit (INDEPENDENT_AMBULATORY_CARE_PROVIDER_SITE_OTHER): Payer: 59 | Admitting: Family Medicine

## 2015-07-25 ENCOUNTER — Encounter: Payer: Self-pay | Admitting: Family Medicine

## 2015-07-25 VITALS — BP 130/70 | HR 67 | Temp 98.3°F | Ht 66.0 in | Wt 233.2 lb

## 2015-07-25 DIAGNOSIS — G5601 Carpal tunnel syndrome, right upper limb: Secondary | ICD-10-CM | POA: Diagnosis not present

## 2015-07-25 DIAGNOSIS — M659 Synovitis and tenosynovitis, unspecified: Secondary | ICD-10-CM | POA: Diagnosis not present

## 2015-07-25 MED ORDER — METHYLPREDNISOLONE ACETATE 40 MG/ML IJ SUSP
20.0000 mg | Freq: Once | INTRAMUSCULAR | Status: AC
  Administered 2015-07-25: 20 mg via INTRA_ARTICULAR

## 2015-07-25 MED ORDER — METHYLPREDNISOLONE ACETATE 40 MG/ML IJ SUSP: 40.0000 mg | Freq: Once | INTRAMUSCULAR | Status: DC

## 2015-07-25 NOTE — Progress Notes (Signed)
Dr. Frederico Hamman T. Raj Landress, MD, [facility] [place], [address] Phone: [phone] Fax: [phone]  07/25/2015  Patient: Nichole Burke, MRN: [id number], DOB: [birth year], 55 y.o.  Primary Physician:  Elsie Stain, MD   Chief Complaint  Patient presents with  . Hand Pain    ?Right Carpal Tunnel   Subjective:   Nichole Burke is a 55 y.o. very pleasant female patient who presents with the following:  R CTS: I saw the patient one year ago, and did warn carpal tunnel and injection on the right, and she had an excellent response with 12 months of relief of her symptoms. She also had been wearing some cockup wrist splints, but she stopped doing this about 2 months ago. Since then she has had some return of her wrist pain as well as some distribution of tingling and numbness in the median nerve distribution.  1 year of relief.  R CTS inj  Past Medical History, Surgical History, Social History, Family History, Problem List, Medications, and Allergies have been reviewed and updated if relevant.  Patient Active Problem List   Diagnosis Date Noted  . Left arm numbness 01/04/2015  . Chronic low back pain 11/21/2014  . Acute thoracic back pain 11/21/2014  . Dysphagia 10/02/2014  . Hyperglycemia 09/20/2014  . OSA (obstructive sleep apnea) 05/17/2014  . Acute bronchitis 07/08/2013  . Skin lesion 05/26/2013  . Back pain 05/26/2013  . Cough 03/09/2012  . Alcohol use ([place]) 12/07/2011  . Routine general medical examination at a health care facility 12/07/2011  . Lipoma 12/07/2011  . Chest pain 10/11/2010  . 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 Knee pain 10/10/2010  . Fatty liver 10/10/2010  . PLANTAR FASCIITIS, RIGHT 06/24/2007  . HYPERCHOLESTEROLEMIA 06/18/2007  . ANXIETY 03/08/2007  . ESSENTIAL HYPERTENSION, BENIGN 03/08/2007  . NICOTINE ADDICTION 09/04/1978    Past Medical History  Diagnosis Date  . HTN  (hypertension)   . Anxiety   . Plantar fasciitis     Right  . Tobacco abuse   . Pure hypercholesterolemia   . Tubular adenoma of colon     Past Surgical History  Procedure Laterality Date  . Tubal ligation  1995    after pregnancy with blighted ovum    Social History   Social History  . Marital Status: Married    Spouse Name: N/A  . Number of Children: 0  . Years of Education: N/A   Occupational History  . 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 Smoking status: Current Every Day Smoker -- 1.00 packs/day for 30 years    Types: Cigarettes  . Smokeless tobacco: Never Used  . Alcohol Use: 1.8 oz/week    3 Standard drinks or equivalent per week     Comment: 5th of liquor a week ([place])  . Drug Use: No  . Sexual Activity: Not on file   Other Topics Concern  . Not on file   Social History Narrative   Married since 1987; lives with husband   No children   Waitress at [employer]   Only exercise is at work    Family History  Problem Relation Age of Onset  . Heart attack Father 43  . Heart disease Father   . Heart attack Mother   . Coronary artery disease Mother   . Diabetes Mother   . Heart disease Mother   . Diabetes Brother   . Kidney  cancer Brother   . Breast cancer Neg Hx   . Colon cancer Neg Hx   . Colon polyps Neg Hx   . Rectal cancer Neg Hx   . Stomach cancer Neg Hx     No Known Allergies  Medication list reviewed and updated in full in [facility].  GEN: no acute illness or fever CV: No chest pain or shortness of breath MSK: detailed above Neuro: neurological signs are described above ROS O/w per HPI  Objective:   BP 130/70 mmHg  Pulse 67  Temp(Src) 98.3 F (36.8 C) (Oral)  Ht 5\' 6"  (1.676 m)  Wt 233 lb 4 oz (105.802 kg)  BMI 37.67 kg/m2  LMP 01/19/2013   GEN: WDWN, NAD, Non-toxic, Alert & Oriented x 3 HEENT: Atraumatic, Normocephalic.  Ears and Nose: No external deformity. EXTR: No  clubbing/cyanosis/edema NEURO: Normal gait.  PSYCH: Normally interactive. Conversant. Not depressed or anxious appearing.  Calm demeanor.   Hand: R Ecchymosis or edema: neg ROM wrist/hand/digits/elbow: full - terminal flexion and ext cause pain Carpals, MCP's, digits: NT Distal Ulna and Radius: NT Supination lift test: neg Ecchymosis or edema: neg Cysts/nodules: neg Finkelstein's test: neg Snuffbox tenderness: neg Scaphoid tubercle: NT Hook of Hamate: NT Resisted supination: NT Full composite fist Grip, all digits: 5/5 str No tenosynovitis Axial load test: neg Phalen's: pos Tinel's: pos Atrophy: neg  Hand sensation: intact   Radiology: No results found.  Assessment and Plan:   Carpal tunnel syndrome, right - Plan: methylPREDNISolone acetate (DEPO-MEDROL) injection 20 mg, DISCONTINUED: methylPREDNISolone acetate (DEPO-MEDROL) injection 40 mg  Synovitis of wrist  Synovitis, reassured, resume CTS splints and OTC NSAIDS  CTS Injection, R Verbal consent was obtained. Risks (including rare risk of infection), benefits, and alternatives were discussed. Prepped with Chloraprep and Ethyl Chloride used for anesthesia. Under sterile conditions,  the patient was injected just ulnar to the palmaris longus tendon at the wrist flexion crease.  The needle was inserted at 45 degree angle aiming distally. Aspiration showed no blood. Medication flowed freely without resistance.  Needle size: 22 gauge 1 1/2 inch Injection: 1/2 cc of Lidocaine 1% and Depo-Medrol 20 mg   Follow-up: prn  Signed,  Dmarcus Decicco T. Sarin [place], MD   Patient's Medications  New Prescriptions   No medications on file  Previous Medications   ASPIRIN 81 MG TABLET    Take 81 mg by mouth daily.     LISINOPRIL (PRINIVIL,ZESTRIL) 20 MG TABLET    Take 1 tablet (20 mg total) by mouth daily.   METOPROLOL SUCCINATE (TOPROL-XL) 25 MG 24 HR TABLET    TAKE 1 TABLET (25 MG TOTAL) BY MOUTH DAILY.   MULTIPLE VITAMIN  (MULTIVITAMIN) TABLET    Take 1 tablet by mouth daily.   PANTOPRAZOLE (PROTONIX) 40 MG TABLET    Take 1 tablet (40 mg total) by mouth daily.   SERTRALINE (ZOLOFT) 100 MG TABLET    Take 0.5 tablets (50 mg total) by mouth daily.  Modified Medications   No medications on file  Discontinued Medications   No medications on file

## 2015-07-25 NOTE — Progress Notes (Signed)
Pre visit review using our clinic review tool, if applicable. No additional management support is needed unless otherwise documented below in the visit note. 

## 2015-08-05 ENCOUNTER — Other Ambulatory Visit: Payer: Self-pay | Admitting: Family Medicine

## 2015-08-05 DIAGNOSIS — R1900 Intra-abdominal and pelvic swelling, mass and lump, unspecified site: Secondary | ICD-10-CM

## 2015-08-05 NOTE — Progress Notes (Signed)
Call pt.  Prev with mass just above the right ovary measuring 19 x 12 x 15 mm. Suspect this to be a cyst. Due for followup pelvic CT.  Ordered.  Thanks.

## 2015-08-06 NOTE — Progress Notes (Signed)
Left message on answering machine for patient to call back. Tried to reach patient at work and was advised that she was not there at that time.

## 2015-08-07 NOTE — Progress Notes (Signed)
Left message on patient's voicemail to return call

## 2015-08-08 NOTE — Progress Notes (Signed)
Patient notified as instructed by telephone and verbalized understanding. Patient stated that she is already working with Rosaria Ferries (referral coordinator) on this.

## 2015-08-16 ENCOUNTER — Telehealth: Payer: Self-pay | Admitting: Family Medicine

## 2015-08-16 DIAGNOSIS — R1900 Intra-abdominal and pelvic swelling, mass and lump, unspecified site: Secondary | ICD-10-CM

## 2015-08-16 NOTE — Telephone Encounter (Signed)
Order updated. Thanks

## 2015-08-16 NOTE — Telephone Encounter (Addendum)
Called radiology, Dr. Autumn Patty.  Would be okay to proceed with u/s.   Orders in EMR.  Thanks.

## 2015-08-21 ENCOUNTER — Other Ambulatory Visit: Payer: Self-pay | Admitting: Family Medicine

## 2015-08-29 ENCOUNTER — Ambulatory Visit
Admission: RE | Admit: 2015-08-29 | Discharge: 2015-08-29 | Disposition: A | Discharge status: Home / Self Care | Payer: 59 | Source: Ambulatory Visit | Attending: Family Medicine | Admitting: Family Medicine

## 2015-08-29 ENCOUNTER — Other Ambulatory Visit: Payer: Self-pay | Admitting: Family Medicine

## 2015-08-29 ENCOUNTER — Telehealth: Payer: Self-pay | Admitting: Family Medicine

## 2015-08-29 DIAGNOSIS — Z85528 Personal history of other malignant neoplasm of kidney: Secondary | ICD-10-CM | POA: Insufficient documentation

## 2015-08-29 DIAGNOSIS — N2889 Other specified disorders of kidney and ureter: Secondary | ICD-10-CM

## 2015-08-29 DIAGNOSIS — R1900 Intra-abdominal and pelvic swelling, mass and lump, unspecified site: Secondary | ICD-10-CM

## 2015-08-29 NOTE — Telephone Encounter (Signed)
Order changed, old order removed.  Thanks for all helping.

## 2015-08-29 NOTE — Telephone Encounter (Signed)
Nichole Burke,[facility], called.  Patient has an order in the computer for MRI Abdomen Limited.  They don't do Limited.  She said the order needs to be changed to MRI Abdomen with and without contrast.

## 2015-08-29 NOTE — Telephone Encounter (Signed)
Cathy at [facility] as instructed by telephone. Tye Maryland stated that it is still wrong and the order needs to read MRI Abdomen with and without contrast. Tye Maryland stated that the order now shows of the liver.

## 2015-08-29 NOTE — Telephone Encounter (Signed)
Spoke to [place] at [place] and was advised that the number for the MRI that needs to be ordered is [phone].

## 2015-08-29 NOTE — Telephone Encounter (Signed)
Changed.  Thanks.

## 2015-08-29 NOTE — Telephone Encounter (Signed)
I can't find MRI with and without in the EMR as a single order, other than one that happens to have the liver notation.  They need to get it straight about what they need and then give me an order number that corresponds.   This patient needs MRI renal protocol, and I don't care what they call it otherwise.  Thanks.  I didn't change the order.

## 2015-09-19 ENCOUNTER — Ambulatory Visit (INDEPENDENT_AMBULATORY_CARE_PROVIDER_SITE_OTHER): Payer: 59 | Admitting: Family Medicine

## 2015-09-19 ENCOUNTER — Encounter: Payer: Self-pay | Admitting: Family Medicine

## 2015-09-19 VITALS — BP 110/70 | HR 64 | Temp 98.1°F | Ht 66.0 in | Wt 230.8 lb

## 2015-09-19 DIAGNOSIS — H6982 Other specified disorders of Eustachian tube, left ear: Secondary | ICD-10-CM | POA: Diagnosis not present

## 2015-09-19 DIAGNOSIS — I10 Essential (primary) hypertension: Secondary | ICD-10-CM | POA: Diagnosis not present

## 2015-09-19 DIAGNOSIS — H6980 Other specified disorders of Eustachian tube, unspecified ear: Secondary | ICD-10-CM | POA: Insufficient documentation

## 2015-09-19 MED ORDER — SERTRALINE HCL 100 MG PO TABS: 50.0000 mg | ORAL_TABLET | Freq: Every day | ORAL | Status: DC

## 2015-09-19 MED ORDER — METOPROLOL SUCCINATE ER 25 MG PO TB24: ORAL_TABLET | ORAL | Status: DC

## 2015-09-19 MED ORDER — PANTOPRAZOLE SODIUM 40 MG PO TBEC: 40.0000 mg | DELAYED_RELEASE_TABLET | Freq: Every day | ORAL | Status: DC

## 2015-09-19 MED ORDER — LISINOPRIL 20 MG PO TABS: 20.0000 mg | ORAL_TABLET | Freq: Every day | ORAL | Status: DC

## 2015-09-19 MED ORDER — FLUTICASONE PROPIONATE 50 MCG/ACT NA SUSP: 2.0000 | Freq: Every day | NASAL | Status: DC

## 2015-09-19 NOTE — Patient Instructions (Addendum)
Use flonase and gently pop your ears.   Schedule a physical when you can.  Take care.  Glad to see you.

## 2015-09-19 NOTE — Progress Notes (Signed)
Pre visit review using our clinic review tool, if applicable. No additional management support is needed unless otherwise documented below in the visit note.  L ear sx.  Feels like the ear is popping "like I'm going up a mountain."  No pain but pressure.  Can hear her pulse in the L ear.  No Sx on the R ear.  Going on for a few days.  No FCNAVD.  Doesn't feel sick o/w.  She can usually pop her ears with elevation change but not recent.  Tried chewing gum, etc.  She isn't stuffy.    She'll schedule CPE, d/w pt.   No abd sx.  D/w pt about prev u/s: 2.6 cm right dominant follicle versus simple cyst. No significant abnormality or acute findings. This doesn't need more f/u, unless she was having a lot of abdominal pain or other sx that would direct f/u.  She has no sx to direct f/u.    Meds, vitals, and allergies reviewed.   ROS: Per HPI unless specifically indicated in ROS section   GEN: nad, alert and oriented HEENT: mucous membranes moist, B TM wnl but no L TM movement on valsalva.  B canals wnl.  OP wnl.  Nasal exam w/o mass or erythema.  NECK: supple w/o LA

## 2015-09-19 NOTE — Assessment & Plan Note (Signed)
Anatomy d/w pt.  Gently valsalva, use flonase, f/u prn.  Encouraged smoking cessation.  No sign of AOM.

## 2015-09-20 ENCOUNTER — Other Ambulatory Visit: Payer: Self-pay | Admitting: Family Medicine

## 2015-09-24 ENCOUNTER — Ambulatory Visit
Admission: RE | Admit: 2015-09-24 | Discharge: 2015-09-24 | Disposition: A | Discharge status: Home / Self Care | Payer: 59 | Source: Ambulatory Visit | Attending: Family Medicine | Admitting: Family Medicine

## 2015-09-24 ENCOUNTER — Other Ambulatory Visit: Payer: 59

## 2015-09-24 DIAGNOSIS — N2889 Other specified disorders of kidney and ureter: Secondary | ICD-10-CM

## 2015-09-24 MED ORDER — GADOBENATE DIMEGLUMINE 529 MG/ML IV SOLN
20.0000 mL | Freq: Once | INTRAVENOUS | Status: AC | PRN
  Administered 2015-09-24: 20 mL via INTRAVENOUS

## 2015-09-25 ENCOUNTER — Telehealth: Payer: Self-pay | Admitting: Family Medicine

## 2015-09-25 NOTE — Telephone Encounter (Signed)
Patient called saying you are Referring her to a Kidney specialist. Patient is very anxious and wants to go ASAP. Please place referral in Epic.

## 2015-09-25 NOTE — Telephone Encounter (Signed)
Let me know if it isn't already in and correct.  Thanks.

## 2015-09-25 NOTE — Telephone Encounter (Signed)
I corrected the order and faxed to [facility] requesting Urgent appt. Spoke with Triage RN and she will work on getting the patient seen either this week or next week.

## 2015-09-26 NOTE — Telephone Encounter (Signed)
Thanks

## 2015-09-27 ENCOUNTER — Other Ambulatory Visit: Payer: Self-pay | Admitting: Urology

## 2015-09-27 ENCOUNTER — Ambulatory Visit (HOSPITAL_COMMUNITY)
Admission: RE | Admit: 2015-09-27 | Discharge: 2015-09-27 | Disposition: A | Discharge status: Home / Self Care | Payer: 59 | Source: Ambulatory Visit | Attending: Urology | Admitting: Urology

## 2015-09-27 DIAGNOSIS — I517 Cardiomegaly: Secondary | ICD-10-CM | POA: Diagnosis not present

## 2015-09-27 DIAGNOSIS — C642 Malignant neoplasm of left kidney, except renal pelvis: Secondary | ICD-10-CM | POA: Diagnosis present

## 2015-10-24 ENCOUNTER — Encounter (HOSPITAL_COMMUNITY): Payer: Self-pay

## 2015-10-24 ENCOUNTER — Encounter (HOSPITAL_COMMUNITY)
Admission: RE | Admit: 2015-10-24 | Discharge: 2015-10-24 | Disposition: A | Discharge status: Home / Self Care | Payer: 59 | Source: Ambulatory Visit | Attending: Urology | Admitting: Urology

## 2015-10-24 DIAGNOSIS — D1730 Benign lipomatous neoplasm of skin and subcutaneous tissue of unspecified sites: Secondary | ICD-10-CM | POA: Insufficient documentation

## 2015-10-24 DIAGNOSIS — Z0183 Encounter for blood typing: Secondary | ICD-10-CM | POA: Insufficient documentation

## 2015-10-24 DIAGNOSIS — Z01812 Encounter for preprocedural laboratory examination: Secondary | ICD-10-CM | POA: Insufficient documentation

## 2015-10-24 DIAGNOSIS — R599 Enlarged lymph nodes, unspecified: Secondary | ICD-10-CM | POA: Insufficient documentation

## 2015-10-24 DIAGNOSIS — R9431 Abnormal electrocardiogram [ECG] [EKG]: Secondary | ICD-10-CM | POA: Diagnosis not present

## 2015-10-24 DIAGNOSIS — Z01818 Encounter for other preprocedural examination: Secondary | ICD-10-CM | POA: Diagnosis not present

## 2015-10-24 DIAGNOSIS — N2889 Other specified disorders of kidney and ureter: Secondary | ICD-10-CM | POA: Diagnosis not present

## 2015-10-24 HISTORY — DX: Gastro-esophageal reflux disease without esophagitis: K21.9

## 2015-10-24 HISTORY — DX: Sleep apnea, unspecified: G47.30

## 2015-10-24 HISTORY — DX: Personal history of other diseases of the digestive system: Z87.19

## 2015-10-24 HISTORY — DX: Presence of spectacles and contact lenses: Z97.3

## 2015-10-24 LAB — CBC
HCT: 43.9 % (ref 36.0–46.0)
Hemoglobin: 14.2 g/dL (ref 12.0–15.0)
MCH: 30 pg (ref 26.0–34.0)
MCHC: 32.3 g/dL (ref 30.0–36.0)
MCV: 92.6 fL (ref 78.0–100.0)
PLATELETS: 156 10*3/uL (ref 150–400)
RBC: 4.74 MIL/uL (ref 3.87–5.11)
RDW: 14.9 % (ref 11.5–15.5)
WBC: 10.7 10*3/uL — AB (ref 4.0–10.5)

## 2015-10-24 LAB — BASIC METABOLIC PANEL
Anion gap: 8 (ref 5–15)
BUN: 15 mg/dL (ref 6–20)
CALCIUM: 9 mg/dL (ref 8.9–10.3)
CHLORIDE: 105 mmol/L (ref 101–111)
CO2: 29 mmol/L (ref 22–32)
CREATININE: 0.69 mg/dL (ref 0.44–1.00)
GFR calc Af Amer: >60 mL/min (ref >60)
Glucose, Bld: 136 mg/dL — ABNORMAL HIGH (ref 65–99)
Potassium: 3.6 mmol/L (ref 3.5–5.1)
SODIUM: 142 mmol/L (ref 135–145)

## 2015-10-24 LAB — ABO/RH: ABO/RH(D): B POS

## 2015-10-24 NOTE — Patient Instructions (Addendum)
Nichole Burke  10/24/2015   Your procedure is scheduled on: Wednesday October 31, 2015  Report to [facility] Main  Entrance take [place]  elevators to 3rd floor to  [place] at 11:15 AM.  Call this number if you have problems the morning of surgery [phone]   Remember: ONLY 1 PERSON MAY GO WITH YOU TO SHORT STAY TO GET  READY MORNING OF [place].  Do not eat food After Midnight but may take clear liquids till 7:15 am day of surgery then nothing by mouth.      Take these medicines the morning of surgery with A SIP OF WATER: Metoprolol; Pantoprazole (Protonix); Sertraline (Zoloft)                                You may not have any metal on your body including hair pins and              piercings  Do not wear jewelry, make-up, lotions, powders or perfumes, deodorant             Do not wear nail polish.  Do not shave  48 hours prior to surgery.              Do not bring valuables to the hospital. [place].  Contacts, dentures or bridgework may not be worn into surgery.  Leave suitcase in the car. After surgery it may be brought to your room.    Special Instructions: FOLLOW SURGEON'S INSTRUCTION IN REGARDS TO BOWEL PREPARATION PRIOR TO SURGICAL PROCEDURE DATE (clear liquid diet day prior to surgery date; magnesium citrate 12 noon day prior to surgery)               _____________________________________________________________________             [facility] - Preparing for Surgery Before surgery, you can play an important role.  Because skin is not sterile, your skin needs to be as free of germs as possible.  You can reduce the number of germs on your skin by washing with CHG (chlorahexidine gluconate) soap before surgery.  CHG is an antiseptic cleaner which kills germs and bonds with the skin to continue killing germs even after washing. Please DO NOT use if you have an allergy to CHG or  antibacterial soaps.  If your skin becomes reddened/irritated stop using the CHG and inform your nurse when you arrive at Short Stay. Do not shave (including legs and underarms) for at least 48 hours prior to the first CHG shower.  You may shave your face/neck. Please follow these instructions carefully:  1.  Shower with CHG Soap the night before surgery and the  morning of Surgery.  2.  If you choose to wash your hair, wash your hair first as usual with your  normal  shampoo.  3.  After you shampoo, rinse your hair and body thoroughly to remove the  shampoo.                           4.  Use CHG as you would any other liquid soap.  You can apply chg directly  to the skin and wash  Gently with a scrungie or clean washcloth.  5.  Apply the CHG Soap to your body ONLY FROM THE NECK DOWN.   Do not use on face/ open                           Wound or open sores. Avoid contact with eyes, ears mouth and genitals (private parts).                       Wash face,  Genitals (private parts) with your normal soap.             6.  Wash thoroughly, paying special attention to the area where your surgery  will be performed.  7.  Thoroughly rinse your body with warm water from the neck down.  8.  DO NOT shower/wash with your normal soap after using and rinsing off  the CHG Soap.                9.  Pat yourself dry with a clean towel.            10.  Wear clean pajamas.            11.  Place clean sheets on your bed the night of your first shower and do not  sleep with pets. Day of Surgery : Do not apply any lotions/deodorants the morning of surgery.  Please wear clean clothes to the hospital/surgery center.  FAILURE TO FOLLOW THESE INSTRUCTIONS MAY RESULT IN THE CANCELLATION OF YOUR SURGERY PATIENT SIGNATURE_________________________________  NURSE SIGNATURE__________________________________  ________________________________________________________________________    CLEAR LIQUID  DIET   Foods Allowed                                                                     Foods Excluded  Coffee and tea, regular and decaf                             liquids that you cannot  Plain Jell-O in any flavor                                             see through such as: Fruit ices (not with fruit pulp)                                     milk, soups, orange juice  Iced Popsicles                                    All solid food Carbonated beverages, regular and diet                                    Cranberry, grape and apple juices Sports drinks like Gatorade Lightly seasoned clear broth or consume(fat free) Sugar, honey syrup  Sample Menu Breakfast                                Lunch                                     Supper Cranberry juice                    Beef broth                            Chicken broth Jell-O                                     Grape juice                           Apple juice Coffee or tea                        Jell-O                                      Popsicle                                                Coffee or tea                        Coffee or tea  _____________________________________________________________________

## 2015-10-31 ENCOUNTER — Encounter (HOSPITAL_COMMUNITY): Payer: Self-pay | Admitting: *Deleted

## 2015-10-31 ENCOUNTER — Encounter (HOSPITAL_COMMUNITY)
Admission: RE | Disposition: A | Discharge status: Home / Self Care | Payer: Self-pay | Source: Ambulatory Visit | Attending: Urology

## 2015-10-31 ENCOUNTER — Inpatient Hospital Stay (HOSPITAL_COMMUNITY): Payer: 59 | Admitting: Anesthesiology

## 2015-10-31 ENCOUNTER — Inpatient Hospital Stay (HOSPITAL_COMMUNITY)
Admission: RE | Admit: 2015-10-31 | Discharge: 2015-11-02 | DRG: 657 | Disposition: A | Discharge status: Home / Self Care | Payer: 59 | Source: Ambulatory Visit | Attending: Urology | Admitting: Urology

## 2015-10-31 DIAGNOSIS — Z6836 Body mass index (BMI) 36.0-36.9, adult: Secondary | ICD-10-CM | POA: Diagnosis not present

## 2015-10-31 DIAGNOSIS — Z8249 Family history of ischemic heart disease and other diseases of the circulatory system: Secondary | ICD-10-CM

## 2015-10-31 DIAGNOSIS — N2889 Other specified disorders of kidney and ureter: Secondary | ICD-10-CM | POA: Diagnosis present

## 2015-10-31 DIAGNOSIS — K449 Diaphragmatic hernia without obstruction or gangrene: Secondary | ICD-10-CM | POA: Diagnosis present

## 2015-10-31 DIAGNOSIS — E669 Obesity, unspecified: Secondary | ICD-10-CM | POA: Diagnosis present

## 2015-10-31 DIAGNOSIS — I10 Essential (primary) hypertension: Secondary | ICD-10-CM | POA: Diagnosis present

## 2015-10-31 DIAGNOSIS — F17210 Nicotine dependence, cigarettes, uncomplicated: Secondary | ICD-10-CM | POA: Diagnosis present

## 2015-10-31 DIAGNOSIS — K219 Gastro-esophageal reflux disease without esophagitis: Secondary | ICD-10-CM | POA: Diagnosis present

## 2015-10-31 DIAGNOSIS — E7800 Pure hypercholesterolemia, unspecified: Secondary | ICD-10-CM | POA: Diagnosis present

## 2015-10-31 DIAGNOSIS — I739 Peripheral vascular disease, unspecified: Secondary | ICD-10-CM | POA: Diagnosis present

## 2015-10-31 DIAGNOSIS — Z833 Family history of diabetes mellitus: Secondary | ICD-10-CM

## 2015-10-31 DIAGNOSIS — G4733 Obstructive sleep apnea (adult) (pediatric): Secondary | ICD-10-CM | POA: Diagnosis present

## 2015-10-31 DIAGNOSIS — D179 Benign lipomatous neoplasm, unspecified: Secondary | ICD-10-CM | POA: Diagnosis present

## 2015-10-31 DIAGNOSIS — C642 Malignant neoplasm of left kidney, except renal pelvis: Secondary | ICD-10-CM | POA: Diagnosis present

## 2015-10-31 DIAGNOSIS — S36030A Superficial (capsular) laceration of spleen, initial encounter: Secondary | ICD-10-CM | POA: Diagnosis not present

## 2015-10-31 DIAGNOSIS — Z8051 Family history of malignant neoplasm of kidney: Secondary | ICD-10-CM

## 2015-10-31 DIAGNOSIS — Z85528 Personal history of other malignant neoplasm of kidney: Secondary | ICD-10-CM | POA: Diagnosis present

## 2015-10-31 HISTORY — PX: ROBOT ASSISTED LAPAROSCOPIC NEPHRECTOMY: SHX5140

## 2015-10-31 HISTORY — PX: PELVIC LYMPH NODE DISSECTION: SHX6543

## 2015-10-31 HISTORY — PX: LIPOMA EXCISION: SHX5283

## 2015-10-31 LAB — HEMOGLOBIN AND HEMATOCRIT, BLOOD
HCT: 42.4 % (ref 36.0–46.0)
Hemoglobin: 14.4 g/dL (ref 12.0–15.0)

## 2015-10-31 LAB — TYPE AND SCREEN
ABO/RH(D): B POS
Antibody Screen: NEGATIVE

## 2015-10-31 SURGERY — NEPHRECTOMY, RADICAL, ROBOT-ASSISTED, LAPAROSCOPIC, ADULT
Anesthesia: General | Site: Abdomen

## 2015-10-31 MED ORDER — HYDROMORPHONE HCL 1 MG/ML IJ SOLN
0.2500 mg | INTRAMUSCULAR | Status: DC | PRN
  Administered 2015-10-31 (×4): 0.25 mg via INTRAVENOUS

## 2015-10-31 MED ORDER — STERILE WATER FOR IRRIGATION IR SOLN
Status: DC | PRN
  Administered 2015-10-31: 1000 mL

## 2015-10-31 MED ORDER — LACTATED RINGERS IV SOLN
INTRAVENOUS | Status: DC
  Administered 2015-10-31: 16:00:00 via INTRAVENOUS
  Administered 2015-10-31: 1000 mL via INTRAVENOUS

## 2015-10-31 MED ORDER — PROPOFOL 10 MG/ML IV BOLUS
INTRAVENOUS | Status: DC | PRN
  Administered 2015-10-31: 200 mg via INTRAVENOUS

## 2015-10-31 MED ORDER — SODIUM CHLORIDE 0.9 % IJ SOLN
INTRAMUSCULAR | Status: AC
Start: 2015-10-31 — End: 2015-10-31
  Filled 2015-10-31: qty 50

## 2015-10-31 MED ORDER — PROPOFOL 10 MG/ML IV BOLUS
INTRAVENOUS | Status: AC
  Filled 2015-10-31: qty 40

## 2015-10-31 MED ORDER — SUGAMMADEX SODIUM 200 MG/2ML IV SOLN
INTRAVENOUS | Status: DC | PRN
Start: 2015-10-31 — End: 2015-10-31
  Administered 2015-10-31: 200 mg via INTRAVENOUS

## 2015-10-31 MED ORDER — ROCURONIUM BROMIDE 100 MG/10ML IV SOLN
INTRAVENOUS | Status: AC
  Filled 2015-10-31: qty 1

## 2015-10-31 MED ORDER — CEFAZOLIN SODIUM-DEXTROSE 2-4 GM/100ML-% IV SOLN
2.0000 g | INTRAVENOUS | Status: AC
  Administered 2015-10-31: 2 g via INTRAVENOUS

## 2015-10-31 MED ORDER — SERTRALINE HCL 50 MG PO TABS
50.0000 mg | ORAL_TABLET | Freq: Every day | ORAL | Status: DC
  Administered 2015-11-01: 50 mg via ORAL
  Filled 2015-10-31: qty 1

## 2015-10-31 MED ORDER — MIDAZOLAM HCL 2 MG/2ML IJ SOLN
INTRAMUSCULAR | Status: AC
  Filled 2015-10-31: qty 2

## 2015-10-31 MED ORDER — FENTANYL CITRATE (PF) 100 MCG/2ML IJ SOLN
INTRAMUSCULAR | Status: AC
  Filled 2015-10-31: qty 2

## 2015-10-31 MED ORDER — OXYCODONE HCL 5 MG PO TABS
5.0000 mg | ORAL_TABLET | ORAL | Status: DC | PRN
  Administered 2015-10-31 – 2015-11-02 (×6): 5 mg via ORAL
  Filled 2015-10-31 (×6): qty 1

## 2015-10-31 MED ORDER — ONDANSETRON HCL 4 MG/2ML IJ SOLN: 4.0000 mg | INTRAMUSCULAR | Status: DC | PRN

## 2015-10-31 MED ORDER — PROMETHAZINE HCL 25 MG/ML IJ SOLN: 6.2500 mg | INTRAMUSCULAR | Status: DC | PRN

## 2015-10-31 MED ORDER — ROCURONIUM BROMIDE 100 MG/10ML IV SOLN
INTRAVENOUS | Status: DC | PRN
  Administered 2015-10-31: 10 mg via INTRAVENOUS
  Administered 2015-10-31: 20 mg via INTRAVENOUS
  Administered 2015-10-31: 60 mg via INTRAVENOUS

## 2015-10-31 MED ORDER — CEFAZOLIN SODIUM-DEXTROSE 2-4 GM/100ML-% IV SOLN
INTRAVENOUS | Status: AC
  Filled 2015-10-31: qty 100

## 2015-10-31 MED ORDER — HYDROMORPHONE HCL 1 MG/ML IJ SOLN
0.5000 mg | INTRAMUSCULAR | Status: DC | PRN
  Administered 2015-10-31 – 2015-11-01 (×3): 1 mg via INTRAVENOUS
  Filled 2015-10-31 (×3): qty 1

## 2015-10-31 MED ORDER — DEXAMETHASONE SODIUM PHOSPHATE 10 MG/ML IJ SOLN
INTRAMUSCULAR | Status: DC | PRN
  Administered 2015-10-31: 6 mg via INTRAVENOUS

## 2015-10-31 MED ORDER — HYDROCODONE-ACETAMINOPHEN 5-325 MG PO TABS: 1.0000 | ORAL_TABLET | Freq: Four times a day (QID) | ORAL | Status: DC | PRN

## 2015-10-31 MED ORDER — ACETAMINOPHEN 500 MG PO TABS
1000.0000 mg | ORAL_TABLET | Freq: Four times a day (QID) | ORAL | Status: AC
  Administered 2015-10-31 – 2015-11-01 (×4): 1000 mg via ORAL
  Filled 2015-10-31 (×4): qty 2

## 2015-10-31 MED ORDER — FENTANYL CITRATE (PF) 100 MCG/2ML IJ SOLN
INTRAMUSCULAR | Status: DC | PRN
  Administered 2015-10-31 (×3): 100 ug via INTRAVENOUS
  Administered 2015-10-31: 50 ug via INTRAVENOUS

## 2015-10-31 MED ORDER — MIDAZOLAM HCL 5 MG/5ML IJ SOLN
INTRAMUSCULAR | Status: DC | PRN
  Administered 2015-10-31: 2 mg via INTRAVENOUS

## 2015-10-31 MED ORDER — BUPIVACAINE LIPOSOME 1.3 % IJ SUSP
20.0000 mL | Freq: Once | INTRAMUSCULAR | Status: AC
  Administered 2015-10-31: 20 mL
  Filled 2015-10-31: qty 20

## 2015-10-31 MED ORDER — ONDANSETRON HCL 4 MG/2ML IJ SOLN
INTRAMUSCULAR | Status: DC | PRN
  Administered 2015-10-31: 4 mg via INTRAVENOUS

## 2015-10-31 MED ORDER — FENTANYL CITRATE (PF) 100 MCG/2ML IJ SOLN: 25.0000 ug | INTRAMUSCULAR | Status: DC | PRN

## 2015-10-31 MED ORDER — METOPROLOL SUCCINATE ER 25 MG PO TB24
25.0000 mg | ORAL_TABLET | Freq: Every day | ORAL | Status: DC
  Administered 2015-11-01: 25 mg via ORAL
  Filled 2015-10-31: qty 1

## 2015-10-31 MED ORDER — PHENYLEPHRINE 40 MCG/ML (10ML) SYRINGE FOR IV PUSH (FOR BLOOD PRESSURE SUPPORT)
PREFILLED_SYRINGE | INTRAVENOUS | Status: AC
  Filled 2015-10-31: qty 10

## 2015-10-31 MED ORDER — GLYCOPYRROLATE 0.2 MG/ML IJ SOLN
INTRAMUSCULAR | Status: DC | PRN
  Administered 2015-10-31: 0.2 mg via INTRAVENOUS

## 2015-10-31 MED ORDER — HYDROMORPHONE HCL 1 MG/ML IJ SOLN
INTRAMUSCULAR | Status: AC
  Filled 2015-10-31: qty 1

## 2015-10-31 MED ORDER — DIPHENHYDRAMINE HCL 50 MG/ML IJ SOLN: 12.5000 mg | Freq: Four times a day (QID) | INTRAMUSCULAR | Status: DC | PRN

## 2015-10-31 MED ORDER — SUGAMMADEX SODIUM 200 MG/2ML IV SOLN
INTRAVENOUS | Status: AC
  Filled 2015-10-31: qty 2

## 2015-10-31 MED ORDER — DIPHENHYDRAMINE HCL 12.5 MG/5ML PO ELIX
12.5000 mg | ORAL_SOLUTION | Freq: Four times a day (QID) | ORAL | Status: DC | PRN
  Administered 2015-11-01: 12.5 mg via ORAL
  Filled 2015-10-31: qty 10

## 2015-10-31 MED ORDER — PANTOPRAZOLE SODIUM 40 MG PO TBEC
40.0000 mg | DELAYED_RELEASE_TABLET | Freq: Every day | ORAL | Status: DC
  Administered 2015-11-01: 40 mg via ORAL
  Filled 2015-10-31: qty 1

## 2015-10-31 MED ORDER — LIDOCAINE HCL (CARDIAC) 20 MG/ML IV SOLN
INTRAVENOUS | Status: DC | PRN
  Administered 2015-10-31: 100 mg via INTRAVENOUS

## 2015-10-31 MED ORDER — HYDRALAZINE HCL 20 MG/ML IJ SOLN: 5.0000 mg | INTRAMUSCULAR | Status: DC | PRN

## 2015-10-31 MED ORDER — ARTIFICIAL TEARS OP OINT
TOPICAL_OINTMENT | OPHTHALMIC | Status: AC
  Filled 2015-10-31: qty 3.5

## 2015-10-31 MED ORDER — FENTANYL CITRATE (PF) 250 MCG/5ML IJ SOLN
INTRAMUSCULAR | Status: AC
  Filled 2015-10-31: qty 5

## 2015-10-31 MED ORDER — DEXTROSE-NACL 5-0.45 % IV SOLN
INTRAVENOUS | Status: DC
  Administered 2015-10-31: 1000 mL via INTRAVENOUS
  Administered 2015-11-01: 05:00:00 via INTRAVENOUS

## 2015-10-31 MED ORDER — LACTATED RINGERS IR SOLN
Status: DC | PRN
  Administered 2015-10-31: 1000 mL

## 2015-10-31 SURGICAL SUPPLY — 59 items
BAG LAPAROSCOPIC 12 15 PORT 16 (BASKET) ×3 IMPLANT
BAG RETRIEVAL 12/15 (BASKET) ×4
CHLORAPREP W/TINT 26ML (MISCELLANEOUS) ×4 IMPLANT
CLIP LIGATING HEM O LOK PURPLE (MISCELLANEOUS) ×4 IMPLANT
CLIP LIGATING HEMO LOK XL GOLD (MISCELLANEOUS) ×4 IMPLANT
CLIP LIGATING HEMO O LOK GREEN (MISCELLANEOUS) ×4 IMPLANT
COVER TIP SHEARS 8 DVNC (MISCELLANEOUS) ×3 IMPLANT
COVER TIP SHEARS 8MM DA VINCI (MISCELLANEOUS) ×1
DECANTER SPIKE VIAL GLASS SM (MISCELLANEOUS) ×4 IMPLANT
DRAIN CHANNEL 15F RND FF 3/16 (WOUND CARE) ×4 IMPLANT
DRAPE ARM DVNC X/XI (DISPOSABLE) ×12 IMPLANT
DRAPE COLUMN DVNC XI (DISPOSABLE) ×3 IMPLANT
DRAPE DA VINCI XI ARM (DISPOSABLE) ×4
DRAPE DA VINCI XI COLUMN (DISPOSABLE) ×1
DRAPE INCISE IOBAN 66X45 STRL (DRAPES) ×4 IMPLANT
DRAPE LAPAROSCOPIC ABDOMINAL (DRAPES) ×4 IMPLANT
DRAPE SHEET LG 3/4 BI-LAMINATE (DRAPES) ×4 IMPLANT
ELECT PENCIL ROCKER SW 15FT (MISCELLANEOUS) ×4 IMPLANT
ELECT REM PT RETURN 9FT ADLT (ELECTROSURGICAL) ×4
ELECTRODE REM PT RTRN 9FT ADLT (ELECTROSURGICAL) ×3 IMPLANT
EVACUATOR SILICONE 100CC (DRAIN) ×4 IMPLANT
GLOVE BIO SURGEON STRL SZ 6.5 (GLOVE) ×4 IMPLANT
GLOVE BIOGEL M STRL SZ7.5 (GLOVE) ×8 IMPLANT
GOWN STRL REUS W/TWL LRG LVL3 (GOWN DISPOSABLE) ×12 IMPLANT
HEMOSTAT SURGICEL 4X8 (HEMOSTASIS) ×8 IMPLANT
KIT BASIN OR (CUSTOM PROCEDURE TRAY) ×4 IMPLANT
LIQUID BAND (GAUZE/BANDAGES/DRESSINGS) ×8 IMPLANT
LOOP VESSEL MAXI BLUE (MISCELLANEOUS) ×4 IMPLANT
NEEDLE INSUFFLATION 14GA 120MM (NEEDLE) ×4 IMPLANT
PORT ACCESS TROCAR AIRSEAL 12 (TROCAR) ×3 IMPLANT
PORT ACCESS TROCAR AIRSEAL 5M (TROCAR) ×1
POUCH ENDO CATCH II 15MM (MISCELLANEOUS) ×4 IMPLANT
RELOAD STAPLER WHITE 60MM (STAPLE) ×27 IMPLANT
SEAL CANN UNIV 5-8 DVNC XI (MISCELLANEOUS) ×12 IMPLANT
SEAL XI 5MM-8MM UNIVERSAL (MISCELLANEOUS) ×4
SET TRI-LUMEN FLTR TB AIRSEAL (TUBING) ×4 IMPLANT
SET TUBE IRRIG SUCTION NO TIP (IRRIGATION / IRRIGATOR) ×4 IMPLANT
SOLUTION ELECTROLUBE (MISCELLANEOUS) ×4 IMPLANT
SPONGE LAP 4X18 X RAY DECT (DISPOSABLE) ×4 IMPLANT
STAPLE ECHEON FLEX 60 POW ENDO (STAPLE) ×4 IMPLANT
STAPLER RELOAD WHITE 60MM (STAPLE) ×36
SUT ETHILON 3 0 PS 1 (SUTURE) IMPLANT
SUT MNCRL AB 4-0 PS2 18 (SUTURE) ×8 IMPLANT
SUT PDS AB 1 CT1 27 (SUTURE) ×12 IMPLANT
SUT VIC AB 2-0 SH 27 (SUTURE) ×1
SUT VIC AB 2-0 SH 27X BRD (SUTURE) ×3 IMPLANT
SUT VIC AB 3-0 SH 27 (SUTURE) ×1
SUT VIC AB 3-0 SH 27XBRD (SUTURE) ×3 IMPLANT
SUT VICRYL 0 UR6 27IN ABS (SUTURE) IMPLANT
TAPE STRIPS DRAPE STRL (GAUZE/BANDAGES/DRESSINGS) ×4 IMPLANT
TOWEL OR NON WOVEN STRL DISP B (DISPOSABLE) ×8 IMPLANT
TRAY FOLEY W/METER SILVER 14FR (SET/KITS/TRAYS/PACK) ×4 IMPLANT
TRAY FOLEY W/METER SILVER 16FR (SET/KITS/TRAYS/PACK) IMPLANT
TRAY LAPAROSCOPIC (CUSTOM PROCEDURE TRAY) ×4 IMPLANT
TROCAR BLADELESS OPT 12M 100M (ENDOMECHANICALS) ×4 IMPLANT
TROCAR BLADELESS OPT 5 100 (ENDOMECHANICALS) IMPLANT
TROCAR UNIVERSAL OPT 12M 100M (ENDOMECHANICALS) ×4 IMPLANT
TROCAR XCEL 12X100 BLDLESS (ENDOMECHANICALS) ×4 IMPLANT
WATER STERILE IRR 1500ML POUR (IV SOLUTION) ×4 IMPLANT

## 2015-10-31 NOTE — Anesthesia Procedure Notes (Signed)
Procedure Name: Intubation Date/Time: 10/31/2015 1:51 PM Performed by: Gean Maidens Pre-anesthesia Checklist: Patient identified, Emergency Drugs available, Suction available, Patient being monitored and Timeout performed Patient Re-evaluated:Patient Re-evaluated prior to inductionOxygen Delivery Method: Circle system utilized Preoxygenation: Pre-oxygenation with 100% oxygen Intubation Type: IV induction Ventilation: Mask ventilation without difficulty Laryngoscope Size: Mac and 4 Grade View: Grade II Tube type: Oral Tube size: 7.0 mm Number of attempts: 1 Airway Equipment and Method: Patient positioned with wedge pillow and Stylet

## 2015-10-31 NOTE — Brief Op Note (Signed)
10/31/2015  4:56 PM  PATIENT:  Nichole Burke  55 y.o. female  PRE-OPERATIVE DIAGNOSIS:  LEFT RENAL MASS, ENLARGED LYMPH NODES, LIPOMA OF SKIN  POST-OPERATIVE DIAGNOSIS:  left renal mass enlarged lymph nodes  PROCEDURE:  Procedure(s): XI ROBOTIC ASSISTED LAPAROSCOPIC NEPHRECTOMY (Left) PELVIC LYMPH NODE DISSECTION (Bilateral) OPEN EXCISION LIPOMA (N/A)  SURGEON:  Surgeon(s) and Role:    * Alexis Frock, MD - Primary  PHYSICIAN ASSISTANT:   ASSISTANTS: Debbrah Alar, PA   ANESTHESIA:   local and general  EBL:  Total I/O In: 1000 [I.V.:1000] Out: 185 [Urine:85; Blood:100]  BLOOD ADMINISTERED:none  DRAINS: JP to bulb   LOCAL MEDICATIONS USED:  MARCAINE     SPECIMEN:  Source of Specimen:  Left radical nephrectomy, Left subcutandous nodule, Periaortic lymph nodes  DISPOSITION OF SPECIMEN:  PATHOLOGY  COUNTS:  YES  TOURNIQUET:  * No tourniquets in log *  DICTATION: .Other Dictation: Dictation Number [id number]  PLAN OF CARE: Admit to inpatient   PATIENT DISPOSITION:  PACU - hemodynamically stable.   Delay start of Pharmacological VTE agent (>24hrs) due to surgical blood loss or risk of bleeding: yes

## 2015-10-31 NOTE — Transfer of Care (Signed)
Immediate Anesthesia Transfer of Care Note  Patient: Nichole Burke  Procedure(s) Performed: Procedure(s): XI ROBOTIC ASSISTED LAPAROSCOPIC NEPHRECTOMY (Left) PELVIC LYMPH NODE DISSECTION (Bilateral) OPEN EXCISION LIPOMA (N/A)  Patient Location: PACU  Anesthesia Type:General  Level of Consciousness: awake, alert  and oriented  Airway & Oxygen Therapy: Patient Spontanous Breathing and Patient connected to face mask oxygen  Post-op Assessment: Report given to RN and Post -op Vital signs reviewed and stable  Post vital signs: Reviewed and stable  Last Vitals:  Filed Vitals:   10/31/15 1126  BP: 134/77  Pulse: 77  Temp: 37.1 C  Resp: 18    Last Pain: There were no vitals filed for this visit.       Complications: No apparent anesthesia complications

## 2015-10-31 NOTE — Discharge Instructions (Signed)

## 2015-10-31 NOTE — Anesthesia Preprocedure Evaluation (Signed)
Anesthesia Evaluation  Patient identified by MRN, date of birth, ID band Patient awake    Reviewed: Allergy & Precautions, NPO status , Patient's Chart, lab work & pertinent test results  Airway Mallampati: II  TM Distance: >3 FB Neck ROM: Full    Dental no notable dental hx.    Pulmonary sleep apnea , Current Smoker,    Pulmonary exam normal breath sounds clear to auscultation       Cardiovascular hypertension, Pt. on medications and Pt. on home beta blockers + Peripheral Vascular Disease  Normal cardiovascular exam Rhythm:Regular Rate:Normal     Neuro/Psych PSYCHIATRIC DISORDERS Anxiety negative neurological ROS     GI/Hepatic Neg liver ROS, hiatal hernia, GERD  ,Fatty Liver.   Endo/Other  negative endocrine ROS  Renal/GU negative Renal ROS  negative genitourinary   Musculoskeletal negative musculoskeletal ROS (+)   Abdominal (+) + obese,   Peds negative pediatric ROS (+)  Hematology negative hematology ROS (+)   Anesthesia Other Findings   Reproductive/Obstetrics negative OB ROS                             Anesthesia Physical Anesthesia Plan  ASA: II  Anesthesia Plan: General   Post-op Pain Management:    Induction: Intravenous  Airway Management Planned: Oral ETT  Additional Equipment:   Intra-op Plan:   Post-operative Plan: Extubation in OR  Informed Consent: I have reviewed the patients History and Physical, chart, labs and discussed the procedure including the risks, benefits and alternatives for the proposed anesthesia with the patient or authorized representative who has indicated his/her understanding and acceptance.   Dental advisory given  Plan Discussed with: CRNA  Anesthesia Plan Comments:         Anesthesia Quick Evaluation

## 2015-10-31 NOTE — H&P (Signed)
Nichole Burke is an 55 y.o. female.    Chief Complaint: Pre op LEFT robotic radical nephrectomy / lipoma excision  HPI:   1 - Left Renal Mass - 5-6cm left lower pole solid, enhancing mass with few scattered borderline peri-renal / periaortic lymph nodes by [place] and MRI 2017 on eval abdominal pain and bloating. No contralateral lesions. Chest imaging w/o masses. 1 artery / 1 vein left renovascular anatomy. Mass predominantly lower pole and encroashes segmental lower pole hilar vessels but does not cross hilar axis. Baseline Cr <1.2. Brother died of metastatic kidney cancer.   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 Moderate bother.  PMH sig for HTN, Tubal. NO CV disease. NO blood thinners. Her PCP is Nichole Stain MD with Nichole Burke.  Today "Nichole Burke" is seen to proceed with LEFT robotic radical nephrectomy and excision of probably subQ lipoma. No interval fevers.    Past Medical History  Diagnosis Date  . HTN (hypertension)   . Anxiety   . Plantar fasciitis     Right  . Tobacco abuse   . Pure hypercholesterolemia   . Tubular adenoma of colon   . Sleep apnea     in home sleep study; OSA; pt does not have CPAP  . Wears glasses   . GERD (gastroesophageal reflux disease)   . History of hiatal hernia     Past Surgical History  Procedure Laterality Date  . Tubal ligation  1995    after pregnancy with blighted ovum    Family History  Problem Relation Age of Onset  . Heart attack Father 62  . Heart disease Father   . Heart attack Mother   . Coronary artery disease Mother   . Diabetes Mother   . Heart disease Mother   . Diabetes Brother   . Kidney cancer Brother   . Breast cancer Neg Hx   . Colon cancer Neg Hx   . Colon polyps Neg Hx   . Rectal cancer Neg Hx   . Stomach cancer Neg Hx    Social History:  reports that she has been smoking Cigarettes.  She has a 30 pack-year smoking history. She has never used smokeless tobacco.  She reports that she drinks about 1.8 oz of alcohol per week. She reports that she does not use illicit drugs.  Allergies: No Known Allergies  No prescriptions prior to admission    No results found for this or any previous visit (from the past 48 hour(s)). No results found.  Review of Systems  Constitutional: Negative.  Negative for fever and chills.  HENT: Negative.   Eyes: Negative.   Respiratory: Negative.   Cardiovascular: Negative.   Gastrointestinal: Negative.  Negative for nausea and vomiting.  Genitourinary: Negative.  Negative for flank pain.  Musculoskeletal: Negative.   Skin: Negative.   Neurological: Negative.   Endo/Heme/Allergies: Negative.   Psychiatric/Behavioral: Negative.     Last menstrual period 01/19/2013. Physical Exam  Constitutional: She is oriented to person, place, and time. She appears well-developed.  HENT:  Head: Normocephalic.  Eyes: Pupils are equal, round, and reactive to light.  Neck: Normal range of motion.  Cardiovascular: Normal rate.   Respiratory: Effort normal.  GI: Soft.  Soft, rubbery, mobile LUQ subQ mass c/w likely lipoma.   Genitourinary:  No CVAT  Musculoskeletal: Normal range of motion.  Neurological: She is alert and oriented to person, place, and time.  Skin: Skin is warm.  Psychiatric: She has a normal mood and affect. Her behavior is normal. Judgment and thought content normal.     Assessment/Plan  1 - Left Renal Mass - very concerning for renal cell carcinoma. As mass >4cm, associated with some regional borderline lymphadenopathy (not bulky nodes), I continue to feel left radical nephrectomy most prudent means of initial management.   We rediscussed the role of radical nephrectomy with the overall goal of complete surgical excision (negative margins) and better staging / diagnosis. We specifically rediscussed that with removal of the kidney there would be an overall renal function decline with attendant risks of renal  failure and need for dialysis in some cases, and need for kidney-friendly lifestyle post-op with excellent blood pressure and glycemic control. We then rediscussed surgical approaches including robotic and open techniques with robotic associated with a shorter convalescence. I showed the patient on their abdomen the approximately 4-6 incision (trocar) sites as well as presumed extraction sites with robotic approach as well as possible open incision sites. We specifically addressed that there may be need to alter operative plans according to intraopertive findings including conversion to open procedure. We rediscussed specific peri-operative risks including bleeding, infection, deep vein thrombosis, pulmonary embolism, compartment syndrome, nuropathy / neuropraxia, heart attack, stroke, death, as well as long-term risks such as non-cure / need for additional therapy and need for imaging and lab based post-op surveillance protocols. We rediscussed typical hospital course of approximately 2 day hospitalization, need for peri-operative drains / catheters, and typical post-hospital course with return to most non-strenuous activities by 2 weeks and ability to return to most jobs and more strenuous activity such as exercise by 6 weeks.   2 - Left Abdominal Lipoma - will remove at time of surgery above, likely via extraction site. Additional risks, benefits discussed.   Nichole Frock, MD 10/31/2015, 7:49 AM

## 2015-10-31 NOTE — Anesthesia Postprocedure Evaluation (Signed)
Anesthesia Post Note  Patient: [name]  Procedure(s) Performed: Procedure(s) (LRB): XI ROBOTIC ASSISTED LAPAROSCOPIC NEPHRECTOMY (Left) PELVIC LYMPH NODE DISSECTION (Bilateral) OPEN EXCISION LIPOMA (N/A)  Patient location during evaluation: PACU Anesthesia Type: General Level of consciousness: awake and alert Pain management: pain level controlled Vital Signs Assessment: post-procedure vital signs reviewed and stable Respiratory status: spontaneous breathing, nonlabored ventilation, respiratory function stable and patient connected to nasal cannula oxygen Cardiovascular status: blood pressure returned to baseline and stable Postop Assessment: no signs of nausea or vomiting Anesthetic complications: no    Last Vitals:  Filed Vitals:   10/31/15 1839 10/31/15 1859  BP: 143/69 156/66  Pulse:  85  Temp:  36.8 C  Resp:  14    Last Pain:  Filed Vitals:   10/31/15 1900  PainSc: 0-No pain                 Donnah Levert J

## 2015-11-01 ENCOUNTER — Encounter (HOSPITAL_COMMUNITY): Payer: Self-pay | Admitting: Urology

## 2015-11-01 LAB — HEMOGLOBIN AND HEMATOCRIT, BLOOD
HCT: 40.9 % (ref 36.0–46.0)
HEMOGLOBIN: 13.1 g/dL (ref 12.0–15.0)

## 2015-11-01 LAB — BASIC METABOLIC PANEL
ANION GAP: 7 (ref 5–15)
BUN: 17 mg/dL (ref 6–20)
CALCIUM: 8.4 mg/dL — AB (ref 8.9–10.3)
CO2: 26 mmol/L (ref 22–32)
Chloride: 104 mmol/L (ref 101–111)
Creatinine, Ser: 1.48 mg/dL — ABNORMAL HIGH (ref 0.44–1.00)
GFR calc Af Amer: 46 mL/min — ABNORMAL LOW (ref >60)
GFR, EST NON AFRICAN AMERICAN: 39 mL/min — AB (ref >60)
GLUCOSE: 203 mg/dL — AB (ref 65–99)
POTASSIUM: 4.5 mmol/L (ref 3.5–5.1)
SODIUM: 137 mmol/L (ref 135–145)

## 2015-11-01 MED ORDER — NICOTINE 14 MG/24HR TD PT24: 14.0000 mg | MEDICATED_PATCH | Freq: Every day | TRANSDERMAL | Status: DC

## 2015-11-01 MED ORDER — BISACODYL 10 MG RE SUPP: 10.0000 mg | Freq: Every day | RECTAL | Status: DC | PRN

## 2015-11-01 NOTE — Progress Notes (Signed)
1 Day Post-Op  Subjective:  1 - Left Renal Mass - s/p left robotic radical nephrectomy on 10/31/15. Bedrest and JP initially as <1cm splenic lac from limited adhesiolysis. Hgb post-op and POD 1 >13 there for JP and foley removed. Path pending.   2 - Left Abdominal Lipoma - excision of likely lipoma at time of radical nephrecotmy 6/281. Path pending.   Today "Nichole Burke" is w/o complaints. Ambulatory, pain managable, Hgb stable JP removed earlier today.  Objective: Vital signs in last 24 hours: Temp:  [98.2 F (36.8 C)-98.5 F (36.9 C)] 98.5 F (36.9 C) (06/29 1226) Pulse Rate:  [50-90] 64 (06/29 1226) Resp:  [7-14] 10 (06/29 0605) BP: (83-186)/(50-87) 121/52 mmHg (06/29 1226) SpO2:  [95 %-98 %] 96 % (06/29 1226) Last BM Date: 10/31/15  Intake/Output from previous day: 06/28 0701 - 06/29 0700 In: 3205 [I.V.:3205] Out: 525 [Urine:235; Drains:190; Blood:100] Intake/Output this shift: Total I/O In: -  Out: 875 [Urine:875]  General appearance: alert, cooperative and appears stated age Head: Normocephalic, without obvious abnormality, atraumatic Eyes: negative Nose: Nares normal. Septum midline. Mucosa normal. No drainage or sinus tenderness. Throat: lips, mucosa, and tongue normal; teeth and gums normal Neck: supple, symmetrical, trachea midline Back: symmetric, no curvature. ROM normal. No CVA tenderness. Resp: non-labored on room air Cardio: Nl rate GI: soft, non-tender; bowel sounds normal; no masses,  no organomegaly Extremities: extremities normal, atraumatic, no cyanosis or edema Pulses: 2+ and symmetric Skin: Skin color, texture, turgor normal. No rashes or lesions Lymph nodes: Cervical, supraclavicular, and axillary nodes normal. Neurologic: Grossly normal Incision/Wound: All incision sites c/d/i. JP removed earlier today and dressing remains dry.   Lab Results:   Recent Labs  10/31/15 1732 11/01/15 0504  HGB 14.4 13.1  HCT 42.4 40.9   BMET  Recent Labs  11/01/15 0504  NA 137  K 4.5  CL 104  CO2 26  GLUCOSE 203*  BUN 17  CREATININE 1.48*  CALCIUM 8.4*   PT/INR No results for input(s): LABPROT, INR in the last 72 hours. ABG No results for input(s): PHART, HCO3 in the last 72 hours.  Invalid input(s): PCO2, PO2  Studies/Results: No results found.  Anti-infectives: Anti-infectives    Start     Dose/Rate Route Frequency Ordered Stop   10/31/15 1142  ceFAZolin (ANCEF) IVPB 2g/100 mL premix     2 g 200 mL/hr over 30 Minutes Intravenous 30 min pre-op 10/31/15 1142 10/31/15 1429      Assessment/Plan:  Doing well POD 1. Plan for DC home in AM.   [facility], Clela Hagadorn 11/01/2015

## 2015-11-01 NOTE — Progress Notes (Signed)
Pt has small drainage from lower portion of midline incision. Placed gauze and paper tape over area to absorb drainage. Will continue to monitor.

## 2015-11-01 NOTE — Progress Notes (Signed)
Patient is refusing Co2 O2 monitoring, she is alert and oriented x's 4. Dr. Tresa Moore is aware.

## 2015-11-01 NOTE — Progress Notes (Signed)
Pt has urinary output from foley of 150 since 7pm 10/31/15. Bladder scan showed 30-residual. Paged on-call PA for urology for update. No orders given. Will continue to monitor.  Conception Oms

## 2015-11-01 NOTE — Care Management Note (Signed)
Case Management Note  Patient Details  Name: Nichole Burke MRN: [id number] Date of Birth: 08/04/1960  Subjective/Objective: 55 y/o f admitted w/L renal mass. S/p L robotic rad nephrectomy. From home.                   Action/Plan:d/c plan home.   Expected Discharge Date:                  Expected Discharge Plan:  Home/Self Care  In-House Referral:     Discharge planning Services  CM Consult  Post Acute Care Choice:    Choice offered to:     DME Arranged:    DME Agency:     HH Arranged:    HH Agency:     Status of Service:  In process, will continue to follow  If discussed at Long Length of Stay Meetings, dates discussed:    Additional Comments:  Dessa Phi, RN 11/01/2015, 11:39 AM

## 2015-11-01 NOTE — Op Note (Signed)
NAMEANASTASHA, Burke              ACCOUNT NO.:  [id number]  MEDICAL RECORD NO.:  [id number]  LOCATION:  23                         FACILITY:  [facility]  PHYSICIAN:  Nichole Frock, MD     DATE OF BIRTH:  July 27, 1960  DATE OF PROCEDURE: 10/31/2015                              OPERATIVE REPORT  DIAGNOSES: 1. Left renal mass with borderline perihilar adenopathy 2. Left upper quadrant subcutaneous nodule.  PROCEDURES: 1. Robotic-assisted laparoscopic left radical nephrectomy and     perihilar lymph node dissection. 2. Excision of left upper quadrant subcutaneous likely lipoma     approximately 4 cm2.  ASSISTANT:  Nichole Burke, Nichole Burke.  ESTIMATED BLOOD LOSS:  150 mL.  DRAINS:  Jackson-Pratt drain to bulb suction.  FINDINGS: 1. Significant desmoplastic reaction around the left kidney, there were     some parasitic vessels around the lower pole. 2. Single artery, single vein, left renovascular anatomy with     prominent lumbar vein as anticipated. 3. Desmoplastic reaction in the area around the perihilar lymph nodes     and the aorta, representative perihilar lymph node samples, set     aside for permanent pathology. 4. Mobile approximately 4 cm2 subcutaneous nodule in the left upper     quadrant approximately 2 fingerbreadths below the costal margin.     This was completely excised, complete excision of lipoma.  COMPLICATIONS:  Small splenic laceration less than 1 cm, this was completely hemostatic following intraoperative maneuvers.  INDICATION:  Nichole Burke is a very pleasant 55 year old lady with strong family history of kidney cancer.  She was found herself to have a relatively large left lower pole renal mass with some questionable perihilar adenopathy worrisome for renal cell carcinoma.  She underwent a staging imaging, which was unremarkable for any distant disease, renal function, overall satisfactory.  Options were discussed for management including surveillance protocols  versus surgical extirpation with and without nephron sparing, with and without minimally-invasive assistance and we both agreed on left radical nephrectomy with perihilar lymph node dissection.  She notably also has a left subcutaneous nodule that was slowly increasing in size for number of years, as moderately bothersome consistent with lipoma.  She also desirous this to be excised as well for cosmesis and for histologic exam.  Informed consent was obtained and placed in the medical record.  PROCEDURE IN DETAIL:  The patient being in [name], was verified. Procedure being left radical nephrectomy and lipoma excision was confirmed.  Procedure was carried out.  Time-out was performed. Intravenous antibiotics were administered.  General endotracheal anesthesia was introduced.  The patient was placed into a left side up full flank position, applying 15 degrees of stable flexion, superior arm elevator, axillary roll, sequential compression devices, bottom leg bent, top leg straight.  She was further fashioned on the operative table using 3-inch tape over foam padding across her supraxiphoid abdomen and her pelvis.  After Foley catheter was placed per urethra to straight drain, sterile field was created by prepping and draping the patient's entire left flank and abdomen using chlorhexidine gluconate. Next, a high-flow, low-pressure pneumoperitoneum was obtained using Veress technique to the left lower quadrant having passed the aspiration and drop test.  Next, an 8-mm robotic camera port was placed and positioned approximately 4 fingerbreadths superomedial to the umbilicus. Laparoscopic examination of the peritoneal cavity revealed no significant adhesions, no visceral injury and no obvious peritoneal advanced disease.  Additional ports were then placed as follows; left subcostal 8-mm robotic port, that was purposely placed just adjacent to the area of the lipoma, to be incorporated  into her lipoma excision.  A 12-mm assistant port in the supraumbilical crease approximately 2 fingerbreadths below the camera port and a 12-mm assistant port in the midline approximately 2-3 fingerbreadths above the camera port, and 8-mm paramedian inferior robotic port approximately 1 handbreadth superior to the pubic ramus and a left far lateral 8-mm robotic port approximately 4 fingerbreadths superomedial to the anterior superior iliac spine.  Robot was docked and passed through electronic checks.  Initial attention was directed at development of retroperitoneum.  Incision was made lateral to the descending colon from the area of the splenic flexure towards the area of the internal ring.  There was significant desmoplastic reaction around the fat of the mesentery and Gerota's fascia.  There were also several splenic attachments between this plane and mesentery, that were released.  During release of one of the splenic attachments, there was a small inadvertent laceration of this plane, it was approximately 7 mm in length.  This was clean based.  This was managed by placement of a Surgicel large sheet, wrapped, fashioned into quarters at the site and placing on gentle pressure for approximately 3 minutes and then allowing pressure to be applied to it with retraction, this resulted in complete and excellent hemostasis of this, very small splenic laceration.  The lower pole of the kidney was identified within Gerota's fascia, this was placed on gentle lateral traction, dissection proceeded medial to this. The ureter was encountered and also placed on gentle lateral traction, this was the gonadal vein and psoas muscle.  Dissection proceeded within this triangle superiorly towards the area of the renal hilum and there was significant desmoplastic reaction around the lower pole of the kidney in its medial attachments and there were several areas of significant lymphadenopathy that were  somewhat concerning for locally- advanced disease.  Renal hilum was encountered and consisted of a single artery, single vein, renovascular anatomy as anticipated.  There was a very large lumbar vein, that was fairly close to the aorta.  As such, a plane of vascular control was chosen just distal to this.  The renal artery was controlled using extra-large Hem-O-Lok clip proximally followed by vascular load stapler distally and the vein using vascular load stapler again just distal to the area of the lumbar insertion. Superomedial attachments were taken down using vascular load stapler partially freeing the adrenal away from the upper pole of the kidney given a lower pole dominant mass.  Lateral attachment was then taken down.  This completely freed up the radical nephrectomy specimen, which was placed into extra-large laparoscopic retrieval bag.  The area of the lateral aorta and hilum was once again carefully inspected and a formal lymphadenectomy was performed of the periaortic region exiting fibrofatty tissue with what appeared to be several small lymph nodes within the area from the midline of the aorta laterally to approximately a cm lateral to the aorta for superoinferior distance approximately 4-5 cm.  Lymphostasis was achieved with cold clips.  This set aside, labeled perihilar lymph nodes.  At this time, all sponge and needle counts were correct.  Hemostasis appeared excellent.  The sling was  once again inspected and found to be completely hemostatic.  Given the small splenic laceration, it was felt that surgical drain to be warranted.  As such, closed suction drain was brought to the previous lateral most robotic port site near the peritoneal cavity.  The specimen was retrieved by connecting the two previous assistant port sites in the midline removing the radical nephrectomy specimen and setting it aside for permanent pathology.  This site was closed at the level of the fascia  using figure-of-eight PDS x7 followed by reapproximation of Scarpa's using running Vicryl and attention was then directed at excision of the small likely lipoma.  An elliptical incision was made approximately 6 cm in length incorporating the previous superior most robotic port site from elliptical incision around the palpable area of likely lipoma.  Incision was carried down onto subcutaneous fibrofatty tissue staying above the area of fascia.  The area in question was easily palpable and cautery dissection was performed of the subcutaneous fat.  This completely excising probable lipoma, setting aside for permanent pathology.  This site was closed in 4 layers; the deep tow layers of 0 Vicryl, next superficial layer of 3-0 Vicryl and the skin with subcuticular Monocryl, resulted in excellent cosmesis and reapproximation of this excision.  All incision sites were infiltrated with dilute lyophilized Marcaine and closed at the level of the skin using subcuticular Monocryl followed by Dermabond.  Procedure was terminated.  The patient tolerated the procedure well.  There were no immediate periprocedural complications other than that mentioned above and the patient was taken to the postanesthesia care unit in stable condition.          ______________________________ Nichole Frock, MD     TM/MEDQ  D:  10/31/2015  T:  11/01/2015  Job:  [id number]

## 2015-11-01 NOTE — Progress Notes (Signed)
Inpatient Diabetes Program Recommendations  AACE/ADA: New Consensus Statement on Inpatient Glycemic Control (2015)  Target Ranges:  Prepandial:   less than 140 mg/dL      Peak postprandial:   less than 180 mg/dL (1-2 hours)      Critically ill patients:  140 - 180 mg/dL   Lab Results  Component Value Date   HGBA1C 5.8 09/04/2014   Results for Nichole Burke, Nichole Burke (MRN [id number]) as of 11/01/2015 14:00  Ref. Range 11/01/2015 05:04  Glucose Latest Ref Range: 65-99 mg/dL 203 (H)   Review of Glycemic Control  Diabetes history: No diagnosis of DM, although HgbA1C on 09/04/2015 indicates pre-diabetes diagnosis. Outpatient Diabetes medications: None Current orders for Inpatient glycemic control: None  Inpatient Diabetes Program Recommendations:    Change diet to CHO mod med Add Novolog sensitive tidwc Check HgbA1C to assess glycemic control prior to hospitalization.  Will follow. Thank you. Lorenda Peck, RD, LDN, CDE Inpatient Diabetes Coordinator [phone]

## 2015-11-02 MED ORDER — OXYCODONE-ACETAMINOPHEN 5-325 MG PO TABS: 1.0000 | ORAL_TABLET | Freq: Four times a day (QID) | ORAL | Status: DC | PRN

## 2015-11-02 MED ORDER — SENNOSIDES-DOCUSATE SODIUM 8.6-50 MG PO TABS: 1.0000 | ORAL_TABLET | Freq: Two times a day (BID) | ORAL | Status: DC

## 2015-11-02 NOTE — Progress Notes (Signed)
Pt noticed a very scant amount of blood in her urine. Will continue to monitor.  Conception Oms

## 2015-11-02 NOTE — Discharge Summary (Signed)
Physician Discharge Summary  Patient ID: Nichole Burke MRN: [id number] DOB/AGE: Feb 15, 1961 55 y.o.  Admit date: 10/31/2015 Discharge date: 11/02/2015  Admission Diagnoses: Left renal mass  Discharge Diagnoses:  Active Problems:   Renal mass   Discharged Condition: good  Hospital Course:   1 - Left Renal Mass - s/p left robotic radical nephrectomy on 10/31/15. Bedrest and JP initially as <1cm splenic lac from limited adhesiolysis. Hgb post-op and POD 1 >13 there for JP and foley removed. Path pending at discharge. By POD 2, the day of discharge, she is ambulatory, pain controlled on PO meds, maintaining PO hydration, and felt to be adequate for discharge.   2 - Left Abdominal Lipoma - excision of likely lipoma at time of radical nephrecotmy 6/281. Path pending.    Consults: None  Significant Diagnostic Studies: labs: as per above  Treatments: surgery: left robotic radical nephrectomy with sub-Q mass excision on 10/31/15  Discharge Exam: Blood pressure 140/63, pulse 68, temperature 98 F (36.7 C), temperature source Oral, resp. rate 14, height 5\' 7"  (1.702 m), weight 105.008 kg (231 lb 8 oz), last menstrual period 01/19/2013, SpO2 97 %. General appearance: alert, cooperative and appears stated age Eyes: negative Nose: Nares normal. Septum midline. Mucosa normal. No drainage or sinus tenderness. Throat: lips, mucosa, and tongue normal; teeth and gums normal Neck: supple, symmetrical, trachea midline Back: symmetric, no curvature. ROM normal. No CVA tenderness. Resp: non-labored on room air Cardio: Nl rate GI: soft, non-tender; bowel sounds normal; no masses,  no organomegaly and stable moderate truncal obesity.  Extremities: extremities normal, atraumatic, no cyanosis or edema Pulses: 2+ and symmetric Skin: Skin color, texture, turgor normal. No rashes or lesions Lymph nodes: Cervical, supraclavicular, and axillary nodes normal. Neurologic: Grossly normal Incision/Wound:  recent incision sites c/d/i, no hernias or drainage.   Disposition: Final discharge disposition not confirmed     Medication List    STOP taking these medications        aspirin EC 81 MG tablet     fluticasone 50 MCG/ACT nasal spray  Commonly known as:  FLONASE     lisinopril 20 MG tablet  Commonly known as:  PRINIVIL,ZESTRIL     multivitamin tablet     naproxen sodium 220 MG tablet  Commonly known as:  ANAPROX      TAKE these medications        metoprolol succinate 25 MG 24 hr tablet  Commonly known as:  TOPROL-XL  TAKE 1 TABLET (25 MG TOTAL) BY MOUTH DAILY.     oxyCODONE-acetaminophen 5-325 MG tablet  Commonly known as:  ROXICET  Take 1-2 tablets by mouth every 6 (six) hours as needed for moderate pain or severe pain. Post-operatively     pantoprazole 40 MG tablet  Commonly known as:  PROTONIX  Take 1 tablet (40 mg total) by mouth daily.     senna-docusate 8.6-50 MG tablet  Commonly known as:  Senokot-S  Take 1 tablet by mouth 2 (two) times daily. While taking pain meds to prevent constipation.     sertraline 100 MG tablet  Commonly known as:  ZOLOFT  Take 0.5 tablets (50 mg total) by mouth daily.           Follow-up Information    Follow up with Alexis Frock, MD On 11/15/2015.   Specialty:  Urology   Why:  at 11:45 for MD visit. Dr. Tresa Moore will call you with pathology results when available.    Contact information:   [address]  [place] [address] [phone]       Signed: Alexis Frock 11/02/2015, 7:14 AM

## 2015-11-16 ENCOUNTER — Telehealth: Payer: Self-pay | Admitting: Family Medicine

## 2015-11-16 NOTE — Telephone Encounter (Signed)
Patient notified as instructed by telephone and verbalized understanding. Patient stated that she is doing really good, but wants to come in and discuss her overall health in general with Dr. Damita Dunnings.  Appointment scheduled with Dr. Damita Dunnings 11/21/15.

## 2015-11-16 NOTE — Telephone Encounter (Signed)
Please call pt.  I realize she had her surgery about 2 weeks ago.  I don't have any news to offer.   I just wanted to make sure she was doing well/recovering.   I wish her the best, as always.  Let me know if there is something I can do.  Thanks.

## 2015-11-16 NOTE — Telephone Encounter (Signed)
Thanks

## 2015-11-21 ENCOUNTER — Encounter: Payer: Self-pay | Admitting: Family Medicine

## 2015-11-21 ENCOUNTER — Ambulatory Visit (INDEPENDENT_AMBULATORY_CARE_PROVIDER_SITE_OTHER): Payer: 59 | Admitting: Family Medicine

## 2015-11-21 VITALS — BP 120/70 | HR 80 | Temp 98.8°F | Ht 66.0 in | Wt 221.0 lb

## 2015-11-21 DIAGNOSIS — F17200 Nicotine dependence, unspecified, uncomplicated: Secondary | ICD-10-CM | POA: Diagnosis not present

## 2015-11-21 DIAGNOSIS — Z905 Acquired absence of kidney: Secondary | ICD-10-CM

## 2015-11-21 DIAGNOSIS — M545 Low back pain: Secondary | ICD-10-CM

## 2015-11-21 DIAGNOSIS — R739 Hyperglycemia, unspecified: Secondary | ICD-10-CM | POA: Diagnosis not present

## 2015-11-21 DIAGNOSIS — Z85528 Personal history of other malignant neoplasm of kidney: Secondary | ICD-10-CM | POA: Diagnosis not present

## 2015-11-21 MED ORDER — CYCLOBENZAPRINE HCL 10 MG PO TABS: 5.0000 mg | ORAL_TABLET | Freq: Three times a day (TID) | ORAL | Status: DC | PRN

## 2015-11-21 NOTE — Progress Notes (Signed)
Pre visit review using our clinic review tool, if applicable. No additional management support is needed unless otherwise documented below in the visit note.  D/w pt about recent renal CA and surgery.  Soreness better after the surgery.  Normal PO intake but had some troubles with hemorrhoids.  D/w pt about trying Prep H.  D/w pt about renal function at baseline after nephrectomy and making sure BP/sugar;etc controlled, avoiding nephrotoxins, smoking, etc.    She'll have f/u with urology yearly in the meantime, d/w pt.    Back pain.  Had taken aleve occ, d/w pt about avoid nsaids.  Worse pain with back extension.  Hasn't tried heat.  D/w pt.  Not midline pain.    PMH and SH reviewed  ROS: Per HPI unless specifically indicated in ROS section   Meds, vitals, and allergies reviewed.   GEN: nad, alert and oriented HEENT: mucous membranes moist NECK: supple w/o LA CV: rrr. PULM: ctab, no inc wob ABD: soft, +bs, healing inc sites noted. EXT: no edema SKIN: no acute rash

## 2015-11-21 NOTE — Patient Instructions (Signed)
Go to the lab on the way out.  We'll contact you with your lab report. Take flexeril as needed for back pain (sedation caution) and use a heating pad.   Take care.  Update me as needed.

## 2015-11-22 DIAGNOSIS — Z905 Acquired absence of kidney: Secondary | ICD-10-CM | POA: Insufficient documentation

## 2015-11-22 LAB — COMPREHENSIVE METABOLIC PANEL
ALBUMIN: 4 g/dL (ref 3.5–5.2)
ALK PHOS: 95 U/L (ref 39–117)
ALT: 20 U/L (ref 0–35)
AST: 17 U/L (ref 0–37)
BILIRUBIN TOTAL: 0.2 mg/dL (ref 0.2–1.2)
BUN: 18 mg/dL (ref 6–23)
CO2: 29 mEq/L (ref 19–32)
Calcium: 9.4 mg/dL (ref 8.4–10.5)
Chloride: 106 mEq/L (ref 96–112)
Creatinine, Ser: 1.32 mg/dL — ABNORMAL HIGH (ref 0.40–1.20)
GFR: 44.71 mL/min — AB (ref >60.00)
Glucose, Bld: 139 mg/dL — ABNORMAL HIGH (ref 70–99)
POTASSIUM: 4.6 meq/L (ref 3.5–5.1)
Sodium: 142 mEq/L (ref 135–145)
TOTAL PROTEIN: 6.8 g/dL (ref 6.0–8.3)

## 2015-11-22 LAB — HEMOGLOBIN A1C: HEMOGLOBIN A1C: 6 % (ref 4.6–6.5)

## 2015-11-22 NOTE — Assessment & Plan Note (Signed)
See notes on labs. 

## 2015-11-22 NOTE — Assessment & Plan Note (Signed)
Encouraged cessation.

## 2015-11-22 NOTE — Assessment & Plan Note (Signed)
Pain with extension, but less with ROM o/w.  No midline pain, B paraspinal soreness but no cva pain.  See AVS.  Avoid nsaids.  D/w pt.  Likely benign MSK source.

## 2015-11-22 NOTE — Assessment & Plan Note (Addendum)
Recheck labs today and update patient.  D/w pt about renal function at baseline after nephrectomy and making sure BP/sugar;etc controlled, avoiding nephrotoxins, smoking, etc.   See notes on labs.   D/w pt about the fact that she may need to see renal clinic at some point.   >25 minutes spent in face to face time with patient, >50% spent in counselling or coordination of care.

## 2015-11-23 ENCOUNTER — Other Ambulatory Visit: Payer: Self-pay | Admitting: Family Medicine

## 2015-11-23 DIAGNOSIS — I10 Essential (primary) hypertension: Secondary | ICD-10-CM

## 2015-12-18 ENCOUNTER — Encounter: Payer: Self-pay | Admitting: *Deleted

## 2016-01-14 ENCOUNTER — Encounter: Payer: Self-pay | Admitting: Internal Medicine

## 2016-01-28 ENCOUNTER — Ambulatory Visit (INDEPENDENT_AMBULATORY_CARE_PROVIDER_SITE_OTHER)
Admission: RE | Admit: 2016-01-28 | Discharge: 2016-01-28 | Disposition: A | Discharge status: Home / Self Care | Payer: 59 | Source: Ambulatory Visit | Attending: Family Medicine | Admitting: Family Medicine

## 2016-01-28 ENCOUNTER — Ambulatory Visit (INDEPENDENT_AMBULATORY_CARE_PROVIDER_SITE_OTHER): Payer: 59 | Admitting: Family Medicine

## 2016-01-28 ENCOUNTER — Encounter: Payer: Self-pay | Admitting: Family Medicine

## 2016-01-28 VITALS — BP 102/58 | HR 77 | Temp 98.8°F | Wt 208.8 lb

## 2016-01-28 DIAGNOSIS — M542 Cervicalgia: Secondary | ICD-10-CM

## 2016-01-28 DIAGNOSIS — I10 Essential (primary) hypertension: Secondary | ICD-10-CM

## 2016-01-28 DIAGNOSIS — Z85528 Personal history of other malignant neoplasm of kidney: Secondary | ICD-10-CM | POA: Diagnosis not present

## 2016-01-28 DIAGNOSIS — Z23 Encounter for immunization: Secondary | ICD-10-CM | POA: Diagnosis not present

## 2016-01-28 NOTE — Progress Notes (Signed)
Neck pain going on for a few weeks, worse recently.  Rare use of flexeril, w/o relief of neck pain. Not taking ibuprofen.  No trauma.  Posterior neck pain.  Pain with neck ROM and along the B traps.  More pain at work, with lifting, etc.  No rash, no bruising. Grip is still wnl B.  No numbness.  Tried ice with some relief.    Recently noted ear popping. She can't get relief with valsalva.    Due for recheck Cr.  D/w pt.   She has cut back a lot on etoh.  D/w pt.  Hasn't tried tylenol recently.  She is cutting back on sugar intake.  Weight down with dietary changes.    Meds, vitals, and allergies reviewed.   ROS: Per HPI unless specifically indicated in ROS section   GEN: nad, alert and oriented HEENT: mucous membranes moist NECK: supple w/o LA CV: rrr.  no murmur PULM: ctab, no inc wob ABD: soft, +bs EXT: no edema SKIN: no acute rash Neck supple, no midline neck pain posteriorly but B paraspinal muscles tight in the C spine.  No rash, no bruising.

## 2016-01-28 NOTE — Progress Notes (Signed)
Pre visit review using our clinic review tool, if applicable. No additional management support is needed unless otherwise documented below in the visit note. 

## 2016-01-28 NOTE — Patient Instructions (Signed)
Go to the lab on the way out.  We'll contact you with your lab and xray report. Try tylenol in the meantime, 2 tabs up to 3 times a day.  Gently try to stretch your neck.  Try ice and heat.  Take care.  Glad to see you.

## 2016-01-29 DIAGNOSIS — M542 Cervicalgia: Secondary | ICD-10-CM | POA: Insufficient documentation

## 2016-01-29 LAB — BASIC METABOLIC PANEL
BUN: 35 mg/dL — ABNORMAL HIGH (ref 6–23)
CHLORIDE: 107 meq/L (ref 96–112)
CO2: 26 mEq/L (ref 19–32)
Calcium: 9.1 mg/dL (ref 8.4–10.5)
Creatinine, Ser: 1.72 mg/dL — ABNORMAL HIGH (ref 0.40–1.20)
GFR: 32.92 mL/min — ABNORMAL LOW (ref >60.00)
Glucose, Bld: 90 mg/dL (ref 70–99)
POTASSIUM: 4.8 meq/L (ref 3.5–5.1)
SODIUM: 140 meq/L (ref 135–145)

## 2016-01-29 NOTE — Assessment & Plan Note (Signed)
Recheck creatinine today.  See notes on labs. ?

## 2016-01-29 NOTE — Assessment & Plan Note (Signed)
Likely muscle spasm, possible degenerative joint disease. Check plain films of neck. Tylenol as needed. Use heat and ice. Discussed with patient about stretching. No focal weakness. Okay for outpatient follow-up. She cut back on alcohol so Tylenol use would be acceptable.

## 2016-01-30 ENCOUNTER — Other Ambulatory Visit: Payer: Self-pay | Admitting: Family Medicine

## 2016-01-30 DIAGNOSIS — Z905 Acquired absence of kidney: Secondary | ICD-10-CM

## 2016-02-16 ENCOUNTER — Other Ambulatory Visit: Payer: Self-pay | Admitting: Family Medicine

## 2016-06-12 ENCOUNTER — Other Ambulatory Visit: Payer: Self-pay | Admitting: Family Medicine

## 2016-07-09 ENCOUNTER — Ambulatory Visit (INDEPENDENT_AMBULATORY_CARE_PROVIDER_SITE_OTHER): Payer: 59 | Admitting: Family Medicine

## 2016-07-09 ENCOUNTER — Encounter: Payer: Self-pay | Admitting: Family Medicine

## 2016-07-09 VITALS — BP 106/64 | HR 61 | Temp 98.8°F | Ht 66.0 in | Wt 219.0 lb

## 2016-07-09 DIAGNOSIS — J209 Acute bronchitis, unspecified: Secondary | ICD-10-CM | POA: Diagnosis not present

## 2016-07-09 DIAGNOSIS — F17200 Nicotine dependence, unspecified, uncomplicated: Secondary | ICD-10-CM | POA: Diagnosis not present

## 2016-07-09 MED ORDER — PREDNISONE 10 MG PO TABS
ORAL_TABLET | ORAL | 0 refills | Status: DC
Start: 2016-07-09 — End: 2016-11-25

## 2016-07-09 MED ORDER — GUAIFENESIN-CODEINE 100-10 MG/5ML PO SYRP: 5.0000 mL | ORAL_SOLUTION | Freq: Every evening | ORAL | 0 refills | Status: DC | PRN

## 2016-07-09 MED ORDER — AZITHROMYCIN 250 MG PO TABS: ORAL_TABLET | ORAL | 0 refills | Status: DC

## 2016-07-09 NOTE — Progress Notes (Signed)
Subjective:    Patient ID: Nichole Burke, female    DOB: 07-03-1960, 56 y.o.   MRN: [id number]  HPI Here for congestion in her chest   Cannot get it to "break loose" Last Friday had chills/fatigue  No fever that she knows of  Then started coughing  Worse at night   Junky sounding  Cannot cough up the phlegm - it gets stuck in her throat and she almost chokes on it  Probably wheezing a bit   Mild runny nose  Ears itch a bit No ST Sinuses do not hurt   Took mucinex (cold formula--unsure which one)   Smoking status about 1 ppd  Not ready to quit  Has cut down this week while sick   Last cxr was 5/17 - no acute changes   Patient Active Problem List   Diagnosis Date Noted  . Acute bronchitis with bronchospasm 07/09/2016  . Neck pain 01/29/2016  . S/p nephrectomy 11/22/2015  . H/O renal cell cancer 08/29/2015  . Left arm numbness 01/04/2015  . Chronic low back pain 11/21/2014  . Acute thoracic back pain 11/21/2014  . Dysphagia 10/02/2014  . Hyperglycemia 09/20/2014  . OSA (obstructive sleep apnea) 05/17/2014  . Skin lesion 05/26/2013  . Back pain 05/26/2013  . Alcohol use 12/07/2011  . Routine general medical examination at a health care facility 12/07/2011  . Lipoma 12/07/2011  . Chest pain 10/11/2010  . 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 Knee pain 10/10/2010  . Fatty liver 10/10/2010  . PLANTAR FASCIITIS, RIGHT 06/24/2007  . HYPERCHOLESTEROLEMIA 06/18/2007  . ANXIETY 03/08/2007  . ESSENTIAL HYPERTENSION, BENIGN 03/08/2007  . NICOTINE ADDICTION 09/04/1978   Past Medical History:  Diagnosis Date  . Anxiety   . GERD (gastroesophageal reflux disease)   . History of hiatal hernia   . HTN (hypertension)   . Plantar fasciitis    Right  . Pure hypercholesterolemia   . Sleep apnea    in home sleep study; OSA; pt does not have CPAP  . Tobacco abuse   . Tubular adenoma of colon   . Wears glasses    Past Surgical History:  Procedure Laterality Date  .  LIPOMA EXCISION N/A 10/31/2015   Procedure: OPEN EXCISION LIPOMA;  Surgeon: Alexis Frock, MD;  Location: [facility];  Service: Urology;  Laterality: N/A;  . PELVIC LYMPH NODE DISSECTION Bilateral 10/31/2015   Procedure: PELVIC LYMPH NODE DISSECTION;  Surgeon: Alexis Frock, MD;  Location: [facility];  Service: Urology;  Laterality: Bilateral;  . ROBOT ASSISTED LAPAROSCOPIC NEPHRECTOMY Left 10/31/2015   Procedure: XI ROBOTIC ASSISTED LAPAROSCOPIC NEPHRECTOMY;  Surgeon: Alexis Frock, MD;  Location: [facility];  Service: Urology;  Laterality: Left;  . TUBAL LIGATION  1995   after pregnancy with blighted ovum   Social History  Substance Use Topics  . Smoking status: Current Every Day Smoker    Packs/day: 1.00    Years: 30.00    Types: Cigarettes  . Smokeless tobacco: Never Used  . Alcohol use 1.8 oz/week    3 Standard drinks or equivalent per week     Comment: drinks every other day   Family History  Problem Relation Age of Onset  . Heart attack Father 26  . Heart disease Father   . Heart attack Mother   . Coronary artery disease Mother   . Diabetes Mother   . Heart disease Mother   . Diabetes Brother   . Kidney cancer Brother   . Breast cancer Neg Hx   .  Colon cancer Neg Hx   . Colon polyps Neg Hx   . Rectal cancer Neg Hx   . Stomach cancer Neg Hx    Allergies  Allergen Reactions  . Aleve [Naproxen Sodium] Other (See Comments)    H/o nephrectomy   . Ibuprofen Other (See Comments)    H/o nephrectomy   . Nsaids Other (See Comments)    H/o nephrectomy    Current Outpatient Prescriptions on File Prior to Visit  Medication Sig Dispense Refill  . cyclobenzaprine (FLEXERIL) 10 MG tablet Take 0.5-1 tablets (5-10 mg total) by mouth 3 (three) times daily as needed for muscle spasms (sedation caution). 30 tablet 1  . metoprolol succinate (TOPROL-XL) 25 MG 24 hr tablet TAKE 1 TABLET (25 MG TOTAL) BY MOUTH DAILY. 90 tablet 3  . pantoprazole (PROTONIX) 40 MG tablet TAKE 1 TABLET (40 MG TOTAL)  BY MOUTH DAILY. 30 tablet 2  . sertraline (ZOLOFT) 100 MG tablet Take 0.5 tablets (50 mg total) by mouth daily. 45 tablet 3   No current facility-administered medications on file prior to visit.      Review of Systems  Constitutional: Positive for appetite change and fatigue. Negative for fever.  HENT: Positive for congestion, postnasal drip, rhinorrhea, sinus pressure, sneezing and sore throat. Negative for ear pain.   Eyes: Negative for pain and discharge.  Respiratory: Positive for cough. Negative for shortness of breath, wheezing and stridor.   Cardiovascular: Negative for chest pain.  Gastrointestinal: Negative for diarrhea, nausea and vomiting.  Genitourinary: Negative for frequency, hematuria and urgency.  Musculoskeletal: Negative for arthralgias and myalgias.  Skin: Negative for rash.  Neurological: Positive for headaches. Negative for dizziness, weakness and light-headedness.  Psychiatric/Behavioral: Negative for confusion and dysphoric mood.       Objective:   Physical Exam  Constitutional: She appears well-developed and well-nourished. No distress.  Fatigued appearing   HENT:  Head: Normocephalic and atraumatic.  Right Ear: External ear normal.  Left Ear: External ear normal.  Mouth/Throat: Oropharynx is clear and moist.  Nares are injected and congested  No sinus tenderness Clear rhinorrhea and post nasal drip   Eyes: Conjunctivae and EOM are normal. Pupils are equal, round, and reactive to light. Right eye exhibits no discharge. Left eye exhibits no discharge.  Neck: Normal range of motion. Neck supple.  Cardiovascular: Normal rate and normal heart sounds.   Pulmonary/Chest: Effort normal. No respiratory distress. She has wheezes. She has no rales. She exhibits no tenderness.  Harsh bs with exp wheeze Mild prolongation of exp phase  No rales  Scattered rhonchi  Lymphadenopathy:    She has no cervical adenopathy.  Neurological: She is alert.  Skin: Skin is  warm and dry. No rash noted.  Psychiatric: She has a normal mood and affect.          Assessment & Plan:   Problem List Items Addressed This Visit      Respiratory   Acute bronchitis with bronchospasm    In a smoker (counseled on cessation) Prednisone taper  zpack  guifen -codeine cough syrup prn  Disc symptomatic care - see instructions on AVS  Update if not starting to improve in a week or if worsening          Other   NICOTINE ADDICTION - Primary    Disc in detail risks of smoking and possible outcomes including copd, vascular/ heart disease, cancer , respiratory and sinus infections  Pt voices understanding Pt states she is not ready to  quit yet

## 2016-07-09 NOTE — Progress Notes (Signed)
Pre visit review using our clinic review tool, if applicable. No additional management support is needed unless otherwise documented below in the visit note. 

## 2016-07-09 NOTE — Patient Instructions (Signed)
Think hard about quitting smoking  Drink lots of fluids  Take mucinex during the day  Use the codeine cough syrup at night with caution  Take the prednisone as directed to help wheezing and tight chest  Take the zpak as directed for bronchitis  Get extra rest if you can  Update if not starting to improve in a week or if worsening

## 2016-07-10 NOTE — Assessment & Plan Note (Signed)
In a smoker (counseled on cessation) Prednisone taper  zpack  guifen -codeine cough syrup prn  Disc symptomatic care - see instructions on AVS  Update if not starting to improve in a week or if worsening

## 2016-07-10 NOTE — Assessment & Plan Note (Signed)
Disc in detail risks of smoking and possible outcomes including copd, vascular/ heart disease, cancer , respiratory and sinus infections  Pt voices understanding Pt states she is not ready to quit yet

## 2016-09-11 ENCOUNTER — Other Ambulatory Visit: Payer: Self-pay | Admitting: *Deleted

## 2016-09-11 MED ORDER — PANTOPRAZOLE SODIUM 40 MG PO TBEC: 40.0000 mg | DELAYED_RELEASE_TABLET | Freq: Every day | ORAL | 1 refills | Status: DC

## 2016-09-11 MED ORDER — METOPROLOL SUCCINATE ER 25 MG PO TB24: ORAL_TABLET | ORAL | 1 refills | Status: DC

## 2016-09-11 MED ORDER — SERTRALINE HCL 100 MG PO TABS: 50.0000 mg | ORAL_TABLET | Freq: Every day | ORAL | 1 refills | Status: DC

## 2016-09-11 NOTE — Telephone Encounter (Signed)
Faxed refill request. Last office visit:   01/28/16 acute Last CPE:  09/19/14 Last Filled:   Metoprolol 90 tablet 3 09/19/2015  Last Filled:  Pantoprazole   30 tablet 2 06/12/2016  Last Filled:    45 tablet 3 09/19/2015  Please advise.

## 2016-09-11 NOTE — Telephone Encounter (Signed)
3 prescriptions sent. Please verify lisinopril use with patient. I can send if refill needed. Due for CPE when possible. Thanks.

## 2016-09-11 NOTE — Telephone Encounter (Signed)
Faxed refill request.  This medication is not on the patient's current meds list.  Please advise.

## 2016-09-12 ENCOUNTER — Other Ambulatory Visit: Payer: Self-pay | Admitting: Family Medicine

## 2016-09-12 DIAGNOSIS — I10 Essential (primary) hypertension: Secondary | ICD-10-CM

## 2016-09-12 NOTE — Telephone Encounter (Signed)
Left message to return call 

## 2016-09-13 ENCOUNTER — Other Ambulatory Visit: Payer: Self-pay | Admitting: Family Medicine

## 2016-09-13 DIAGNOSIS — I10 Essential (primary) hypertension: Secondary | ICD-10-CM

## 2016-09-15 MED ORDER — LISINOPRIL 20 MG PO TABS: 20.0000 mg | ORAL_TABLET | Freq: Every day | ORAL | 0 refills | Status: DC

## 2016-09-15 NOTE — Telephone Encounter (Signed)
Spoke with patient who says she is taking the Lisinopril 20 mg.  She is scheduling a CPE in the near future.

## 2016-09-15 NOTE — Telephone Encounter (Signed)
Last office visit 07/09/2016 with Dr. Glori Bickers.  Lisinopril no on current medication list.  Refill?

## 2016-09-16 NOTE — Telephone Encounter (Signed)
Sent. Thanks.   

## 2016-11-16 ENCOUNTER — Other Ambulatory Visit: Payer: Self-pay | Admitting: Family Medicine

## 2016-11-16 DIAGNOSIS — R739 Hyperglycemia, unspecified: Secondary | ICD-10-CM

## 2016-11-17 ENCOUNTER — Other Ambulatory Visit (INDEPENDENT_AMBULATORY_CARE_PROVIDER_SITE_OTHER): Payer: 59

## 2016-11-17 DIAGNOSIS — R739 Hyperglycemia, unspecified: Secondary | ICD-10-CM | POA: Diagnosis not present

## 2016-11-17 LAB — LIPID PANEL
CHOL/HDL RATIO: 4
Cholesterol: 178 mg/dL (ref 0–200)
HDL: 41 mg/dL (ref >39.00)
LDL Cholesterol: 104 mg/dL — ABNORMAL HIGH (ref 0–99)
NONHDL: 137.41
Triglycerides: 165 mg/dL — ABNORMAL HIGH (ref 0.0–149.0)
VLDL: 33 mg/dL (ref 0.0–40.0)

## 2016-11-17 LAB — COMPREHENSIVE METABOLIC PANEL
ALT: 32 U/L (ref 0–35)
AST: 33 U/L (ref 0–37)
Albumin: 4.1 g/dL (ref 3.5–5.2)
Alkaline Phosphatase: 74 U/L (ref 39–117)
BILIRUBIN TOTAL: 0.4 mg/dL (ref 0.2–1.2)
BUN: 19 mg/dL (ref 6–23)
CO2: 29 meq/L (ref 19–32)
CREATININE: 1.26 mg/dL — AB (ref 0.40–1.20)
Calcium: 9.4 mg/dL (ref 8.4–10.5)
Chloride: 106 mEq/L (ref 96–112)
GFR: 47 mL/min — AB (ref >60.00)
GLUCOSE: 104 mg/dL — AB (ref 70–99)
Potassium: 5.1 mEq/L (ref 3.5–5.1)
SODIUM: 140 meq/L (ref 135–145)
Total Protein: 6.6 g/dL (ref 6.0–8.3)

## 2016-11-17 LAB — HEMOGLOBIN A1C: Hgb A1c MFr Bld: 5.9 % (ref 4.6–6.5)

## 2016-11-21 ENCOUNTER — Other Ambulatory Visit: Payer: Self-pay | Admitting: Urology

## 2016-11-21 ENCOUNTER — Ambulatory Visit (HOSPITAL_COMMUNITY)
Admission: RE | Admit: 2016-11-21 | Discharge: 2016-11-21 | Disposition: A | Discharge status: Home / Self Care | Payer: 59 | Source: Ambulatory Visit | Attending: Urology | Admitting: Urology

## 2016-11-21 DIAGNOSIS — C642 Malignant neoplasm of left kidney, except renal pelvis: Secondary | ICD-10-CM | POA: Insufficient documentation

## 2016-11-25 ENCOUNTER — Ambulatory Visit (INDEPENDENT_AMBULATORY_CARE_PROVIDER_SITE_OTHER): Payer: 59 | Admitting: Family Medicine

## 2016-11-25 ENCOUNTER — Encounter: Payer: Self-pay | Admitting: Family Medicine

## 2016-11-25 VITALS — BP 118/68 | HR 65 | Temp 98.5°F | Ht 66.0 in | Wt 231.8 lb

## 2016-11-25 DIAGNOSIS — Z0000 Encounter for general adult medical examination without abnormal findings: Secondary | ICD-10-CM | POA: Diagnosis not present

## 2016-11-25 DIAGNOSIS — F411 Generalized anxiety disorder: Secondary | ICD-10-CM

## 2016-11-25 DIAGNOSIS — I10 Essential (primary) hypertension: Secondary | ICD-10-CM

## 2016-11-25 DIAGNOSIS — Z85528 Personal history of other malignant neoplasm of kidney: Secondary | ICD-10-CM

## 2016-11-25 DIAGNOSIS — M545 Low back pain: Secondary | ICD-10-CM

## 2016-11-25 DIAGNOSIS — Z789 Other specified health status: Secondary | ICD-10-CM

## 2016-11-25 DIAGNOSIS — I779 Disorder of arteries and arterioles, unspecified: Secondary | ICD-10-CM

## 2016-11-25 DIAGNOSIS — Z7289 Other problems related to lifestyle: Secondary | ICD-10-CM

## 2016-11-25 DIAGNOSIS — F1090 Alcohol use, unspecified, uncomplicated: Secondary | ICD-10-CM

## 2016-11-25 MED ORDER — SERTRALINE HCL 100 MG PO TABS: 50.0000 mg | ORAL_TABLET | Freq: Every day | ORAL | 3 refills | Status: DC

## 2016-11-25 MED ORDER — LISINOPRIL 20 MG PO TABS: 20.0000 mg | ORAL_TABLET | Freq: Every day | ORAL | 3 refills | Status: DC

## 2016-11-25 MED ORDER — METOPROLOL SUCCINATE ER 25 MG PO TB24: ORAL_TABLET | ORAL | 3 refills | Status: DC

## 2016-11-25 MED ORDER — PANTOPRAZOLE SODIUM 40 MG PO TBEC: 40.0000 mg | DELAYED_RELEASE_TABLET | Freq: Every day | ORAL | 3 refills | Status: DC

## 2016-11-25 NOTE — Patient Instructions (Addendum)
You can call for a mammogram at these locations:  [facility] [place] [phone]  Call about a colonoscopy when possible.  I would get a flu shot each fall.   Take care.  Glad to see you.  I'll check with cardiology in the meantime.

## 2016-11-25 NOTE — Progress Notes (Signed)
CPE- See plan.  Routine anticipatory guidance given to patient.  See health maintenance.  The possibility exists that previously documented standard health maintenance information may have been brought forward from a previous encounter into this note.  If needed, that same information has been updated to reflect the current situation based on today's encounter.    Tetanus 2013 Flu shot- encouraged Tobacco and etoh discussed, precontemplative on both.  Living will, encouraged. Husband designated if incapacitated.  Mammogram due. She'll call about this. d/w pt.   DXA not due.  Pap 2014- declined 2018.  No discharge, no bleeding.  No hot flashes but some occ night sweats attributed to menopause, at baseline- going on for a few years.   Colonoscopy 2014, due, d/w pt.  Letter given to patient.  Diet and exercise- d/w pt. Encouraged both.  [facility] report, so HIV and HCV screening would have been done at that point.   Hypertension:    Using medication without problems or lightheadedness: yes Chest pain with exertion:no Edema:some BLE edema noted but Cr improved.   Short of breath:no   S/p nephrectomy.  Saw urology doc last week, I am awaiting correspondence. She had a reassuring report, per her description.  Aorta dilation, d/w pt.  Prev imaging done.  No CP.  D/w pt.   She has had her back injected per Dr. Nelva Bush with [facility]. Still with B burning in the thighs.    PMH and SH reviewed  Meds, vitals, and allergies reviewed.   ROS: Per HPI.  Unless specifically indicated otherwise in HPI, the patient denies:  General: fever. Eyes: acute vision changes ENT: sore throat Cardiovascular: chest pain Respiratory: SOB GI: vomiting GU: dysuria Musculoskeletal: acute back pain Derm: acute rash Neuro: acute motor dysfunction Psych: worsening mood Endocrine: polydipsia Heme: bleeding Allergy: hayfever  GEN: nad, alert and  oriented HEENT: mucous membranes moist NECK: supple w/o LA CV: rrr. PULM: ctab, no inc wob ABD: soft, +bs EXT: no edema SKIN: no acute rash

## 2016-11-26 NOTE — Assessment & Plan Note (Signed)
Improved on sertraline, she did not want to change her dose. Encouraged alcohol taper. No suicidal or homicidal intent. Okay for outpatient follow-up.

## 2016-11-26 NOTE — Assessment & Plan Note (Signed)
Discussed with patient about slow taper.

## 2016-11-26 NOTE — Assessment & Plan Note (Signed)
Awaiting input from urology clinic. See above.

## 2016-11-26 NOTE — Assessment & Plan Note (Signed)
Tetanus 2013 Flu shot- encouraged Tobacco and etoh discussed, precontemplative on both.  Living will, encouraged. Husband designated if incapacitated.  Mammogram due. She'll call about this. d/w pt.   DXA not due.  Pap 2014- declined 2018.  No discharge, no bleeding.  No hot flashes but some occ night sweats attributed to menopause, at baseline- going on for a few years.   Colonoscopy 2014, due, d/w pt.  Letter given to patient.  Diet and exercise- d/w pt. Encouraged both.  [facility] report, so HIV and HCV screening would have been done at that point.

## 2016-11-26 NOTE — Assessment & Plan Note (Signed)
Blood pressure is reasonable. Her creatinine is improved from previous elevation. Discussed with patient. Encouraged smoking cessation. No change in meds. She agrees.

## 2016-11-26 NOTE — Assessment & Plan Note (Signed)
I'll check with cardiology about possible follow-up with echo versus MRI. Discussed with patient.

## 2016-11-26 NOTE — Assessment & Plan Note (Signed)
She has follow-up with Dr. Nelva Bush pending and I want her to describe to him the burning she is having in the bilateral thighs. It could be that she has meralgia paresthetica but it would be atypical for her to have bilateral symptoms and I don't know if this is coming from her back. Discussed with patient. She will ask orthopedics about this.

## 2016-11-30 ENCOUNTER — Telehealth: Payer: Self-pay | Admitting: Family Medicine

## 2016-11-30 NOTE — Telephone Encounter (Signed)
Notify pt.  I checked with Dr. Aundra Dubin.  She has a very mildly enlarged ascending aorta. Could just look at it with an echo, not urgent.  Have her notify cardiology when she wants to go for f/u.  Thanks.

## 2016-12-01 NOTE — Telephone Encounter (Signed)
Left message on patient's voicemail to return call

## 2016-12-01 NOTE — Telephone Encounter (Signed)
Patient advised.

## 2016-12-13 ENCOUNTER — Other Ambulatory Visit: Payer: Self-pay | Admitting: Family Medicine

## 2017-03-12 ENCOUNTER — Other Ambulatory Visit: Payer: Self-pay | Admitting: Family Medicine

## 2017-03-12 NOTE — Telephone Encounter (Signed)
Left detailed message, looks like patient was given a year refills on these rxs (printed rx). Patient to call back to confirm.

## 2017-03-12 NOTE — Telephone Encounter (Signed)
Patient returned telephone call and said the prescription was written for a year.

## 2017-03-15 ENCOUNTER — Other Ambulatory Visit: Payer: Self-pay | Admitting: Family Medicine

## 2017-03-29 IMAGING — CR DG CHEST 2V
2 series · 2 of 2 positions shown · non-contrast
Comparison: None.

CLINICAL DATA: Preop LEFT kidney cancer.

EXAM:
CHEST  2 VIEW

[w chest pa]
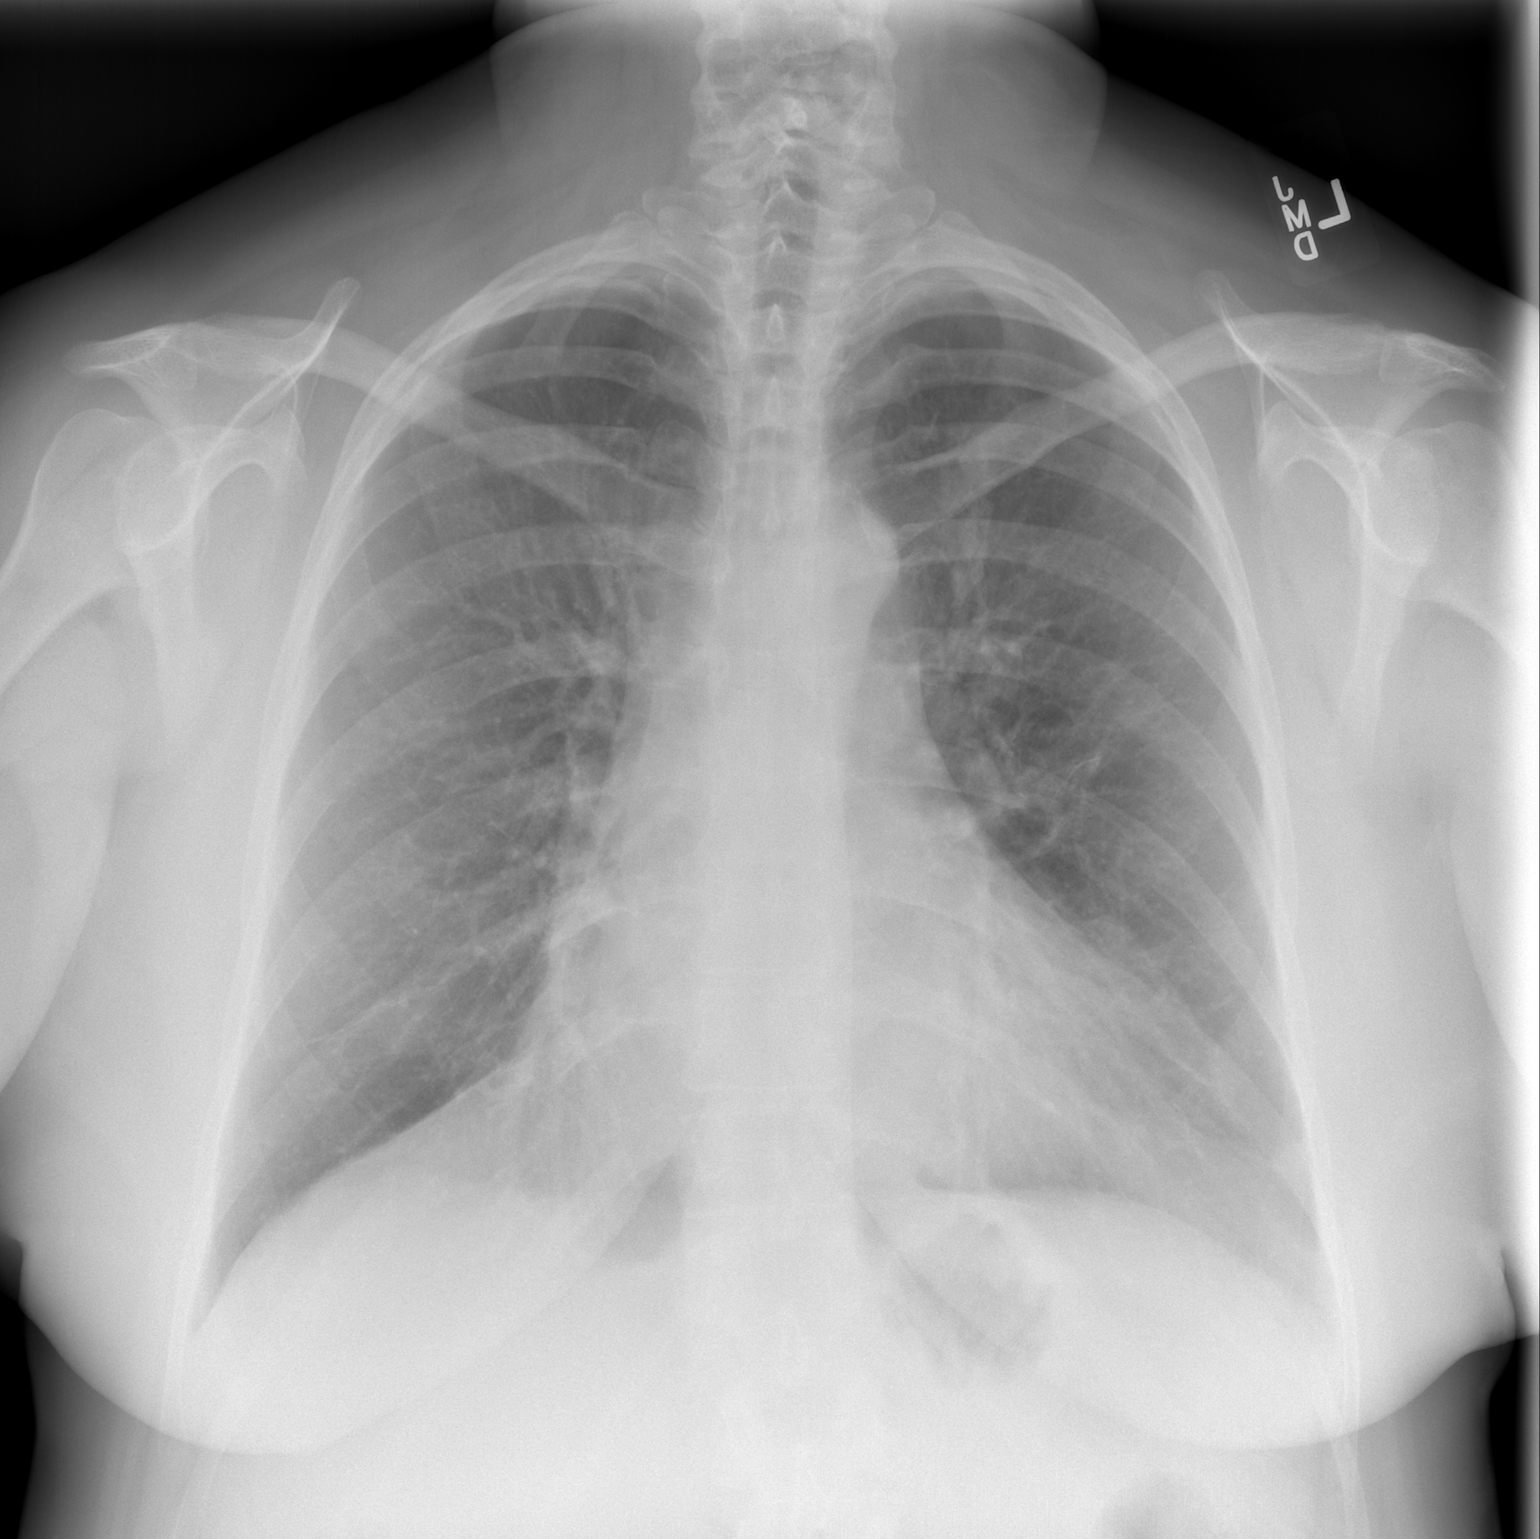

[w chest lat]
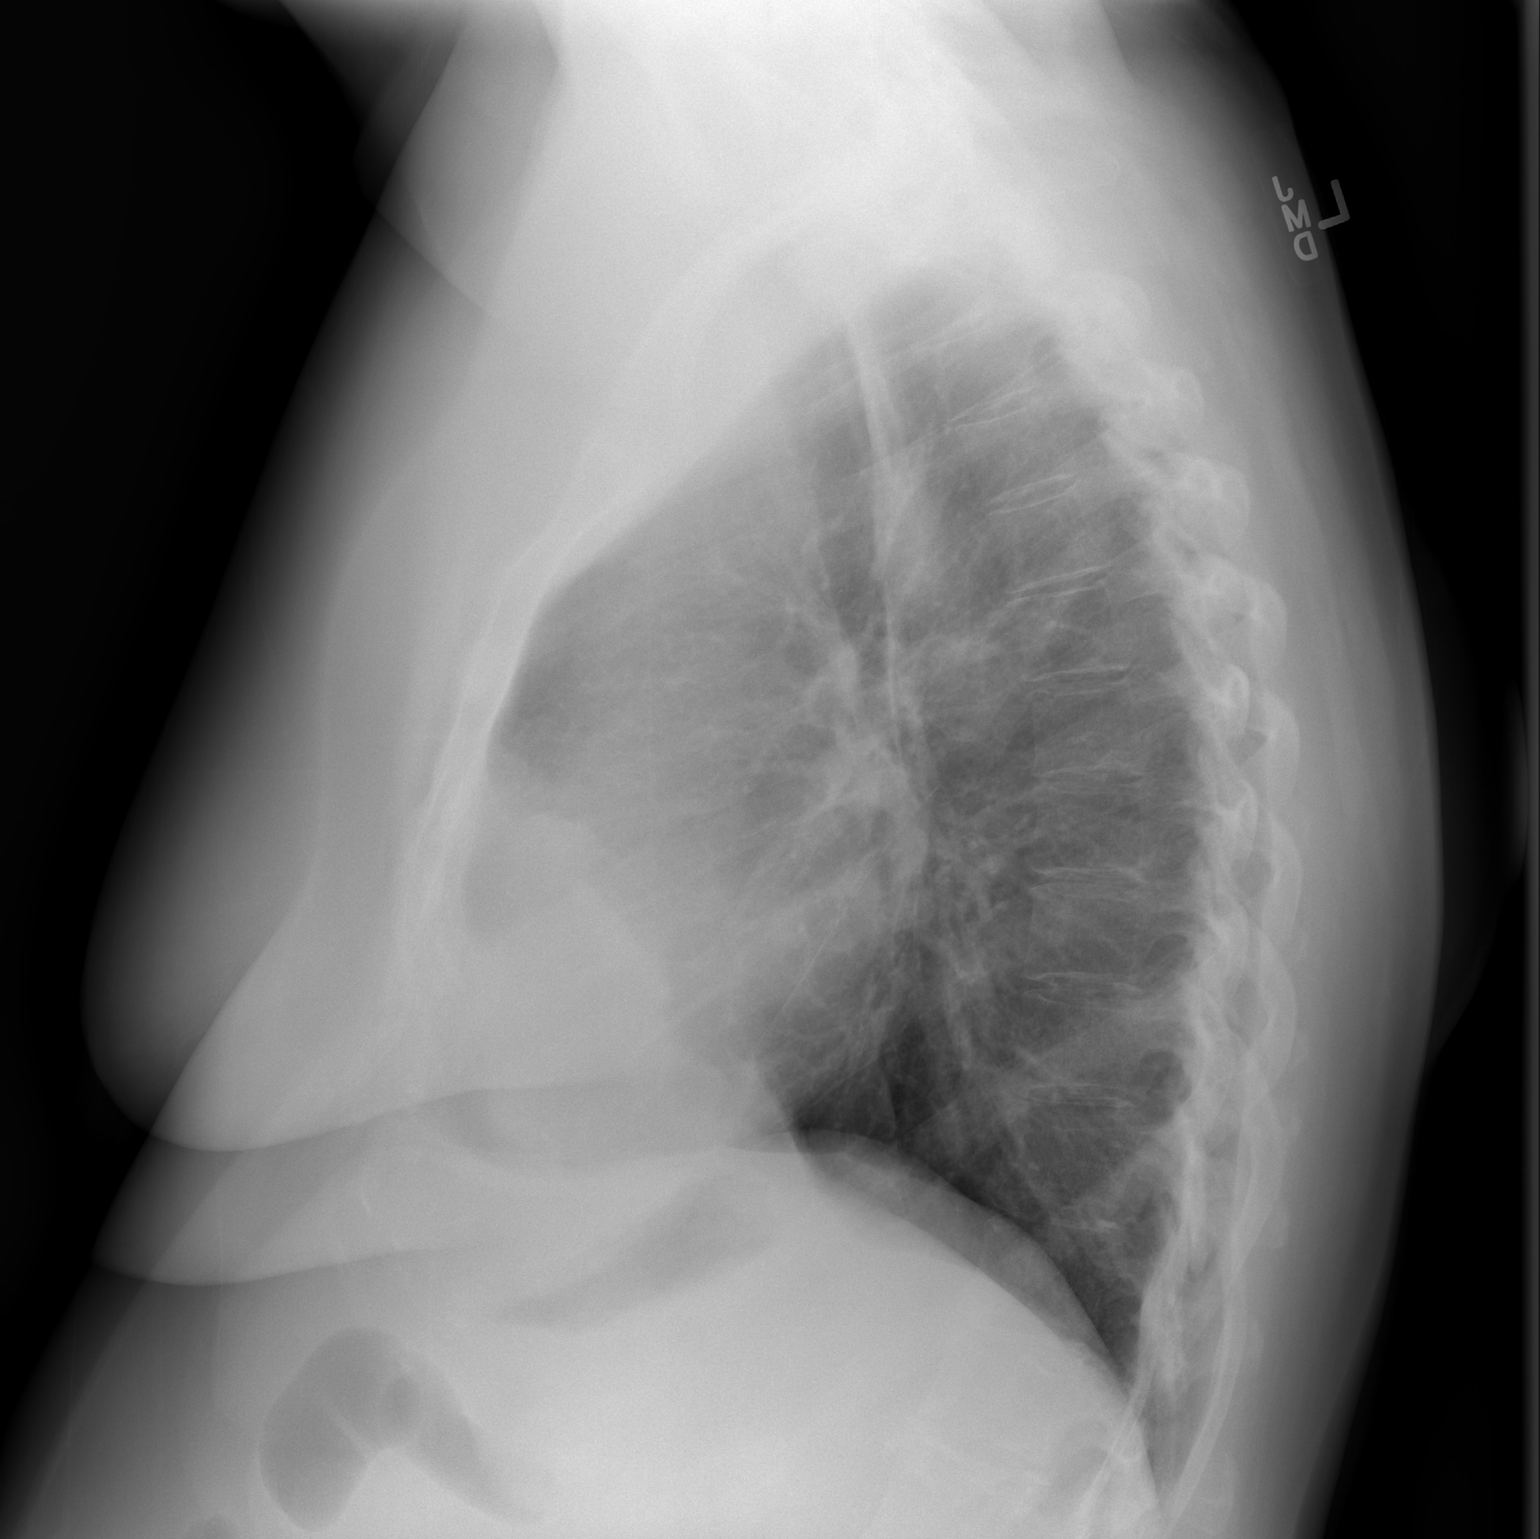

[2 of 2 positions shown; findings below may reference images not displayed]

FINDINGS: Mild cardiac enlargement. No mediastinal masses. Aorta tortuous,
mildly prominent in its ascending segment. Both lungs are clear. The
visualized skeletal structures are unremarkable.
IMPRESSION: Cardiomegaly. No active disease. No visible pulmonary nodules or
osseous lesions.

## 2017-05-11 ENCOUNTER — Other Ambulatory Visit: Payer: Self-pay | Admitting: *Deleted

## 2017-05-12 ENCOUNTER — Telehealth: Payer: Self-pay | Admitting: Family Medicine

## 2017-05-12 NOTE — Telephone Encounter (Signed)
Copied from [place]. Topic: Quick Communication - See Telephone Encounter >> May 12, 2017  3:58 PM Robina Ade, Helene Kelp D wrote: CRM for notification. See Telephone encounter for: 05/12/17. Patient called and said that she would like to talk to Josetta Huddle, CMA about her medication sertraline (ZOLOFT) 100 MG tablet. Please call patient back, thanks.

## 2017-05-12 NOTE — Telephone Encounter (Signed)
I spoke with pt and Lugene left message last night that could not get refill at [facility]. Pt is not sure why cannot get refill on sertraline. Lugene CMA said pt should already have available refills [facility]. Pt will contact pharmacy and if has further questions will cb.

## 2017-05-18 ENCOUNTER — Ambulatory Visit: Payer: Self-pay | Admitting: *Deleted

## 2017-05-18 NOTE — Telephone Encounter (Signed)
   Reason for Disposition . Caller has NON-URGENT medication question about med that PCP prescribed and triager unable to answer question  Answer Assessment - Initial Assessment Questions 1. SYMPTOMS: "Do you have any symptoms?"     Arthritis pain Patient only has 1 kidney- so she has to be careful about her medications. She has been using Tylenol Extra Strength and she wants to know if it is safe to use Tylenol Arthritis. She would like to hear from Dr Damita Dunnings about this.  Protocols used: MEDICATION QUESTION CALL-A-AH

## 2017-05-18 NOTE — Telephone Encounter (Signed)
Would be okay to take.  Tylenol shouldn't have any renal interaction.  Thanks.

## 2017-05-18 NOTE — Telephone Encounter (Signed)
Patient advised.

## 2017-05-18 NOTE — Telephone Encounter (Signed)
11/25/16 annual exam.Please advise.

## 2017-05-21 ENCOUNTER — Other Ambulatory Visit: Payer: Self-pay | Admitting: *Deleted

## 2017-05-21 MED ORDER — SERTRALINE HCL 100 MG PO TABS: 50.0000 mg | ORAL_TABLET | Freq: Every day | ORAL | 1 refills | Status: DC

## 2017-06-24 DIAGNOSIS — M1712 Unilateral primary osteoarthritis, left knee: Secondary | ICD-10-CM | POA: Diagnosis not present

## 2017-06-24 DIAGNOSIS — M545 Low back pain: Secondary | ICD-10-CM | POA: Diagnosis not present

## 2017-06-24 DIAGNOSIS — M7062 Trochanteric bursitis, left hip: Secondary | ICD-10-CM | POA: Diagnosis not present

## 2017-07-02 DIAGNOSIS — M47816 Spondylosis without myelopathy or radiculopathy, lumbar region: Secondary | ICD-10-CM | POA: Diagnosis not present

## 2017-07-16 DIAGNOSIS — M48061 Spinal stenosis, lumbar region without neurogenic claudication: Secondary | ICD-10-CM | POA: Diagnosis not present

## 2017-07-16 DIAGNOSIS — M4726 Other spondylosis with radiculopathy, lumbar region: Secondary | ICD-10-CM | POA: Diagnosis not present

## 2017-07-16 DIAGNOSIS — M545 Low back pain: Secondary | ICD-10-CM | POA: Diagnosis not present

## 2017-07-17 ENCOUNTER — Other Ambulatory Visit: Payer: Self-pay | Admitting: *Deleted

## 2017-08-04 DIAGNOSIS — M47816 Spondylosis without myelopathy or radiculopathy, lumbar region: Secondary | ICD-10-CM | POA: Diagnosis not present

## 2017-08-05 ENCOUNTER — Other Ambulatory Visit: Payer: Self-pay | Admitting: Family Medicine

## 2017-08-22 ENCOUNTER — Other Ambulatory Visit: Payer: Self-pay | Admitting: Family Medicine

## 2017-11-01 ENCOUNTER — Other Ambulatory Visit: Payer: Self-pay | Admitting: Family Medicine

## 2017-11-20 ENCOUNTER — Ambulatory Visit (HOSPITAL_COMMUNITY)
Admission: RE | Admit: 2017-11-20 | Discharge: 2017-11-20 | Disposition: A | Discharge status: Home / Self Care | Payer: BLUE CROSS/BLUE SHIELD | Source: Ambulatory Visit | Attending: Urology | Admitting: Urology

## 2017-11-20 ENCOUNTER — Other Ambulatory Visit: Payer: Self-pay | Admitting: Urology

## 2017-11-20 DIAGNOSIS — J9811 Atelectasis: Secondary | ICD-10-CM | POA: Diagnosis not present

## 2017-11-20 DIAGNOSIS — I517 Cardiomegaly: Secondary | ICD-10-CM | POA: Insufficient documentation

## 2017-11-20 DIAGNOSIS — C642 Malignant neoplasm of left kidney, except renal pelvis: Secondary | ICD-10-CM

## 2017-11-23 ENCOUNTER — Other Ambulatory Visit: Payer: Self-pay | Admitting: Family Medicine

## 2017-11-23 ENCOUNTER — Other Ambulatory Visit (INDEPENDENT_AMBULATORY_CARE_PROVIDER_SITE_OTHER): Payer: BLUE CROSS/BLUE SHIELD

## 2017-11-23 DIAGNOSIS — I10 Essential (primary) hypertension: Secondary | ICD-10-CM

## 2017-11-23 DIAGNOSIS — R739 Hyperglycemia, unspecified: Secondary | ICD-10-CM

## 2017-11-23 LAB — LIPID PANEL
CHOL/HDL RATIO: 5
Cholesterol: 185 mg/dL (ref 0–200)
HDL: 36.3 mg/dL — AB (ref >39.00)
LDL CALC: 115 mg/dL — AB (ref 0–99)
NonHDL: 148.7
Triglycerides: 167 mg/dL — ABNORMAL HIGH (ref 0.0–149.0)
VLDL: 33.4 mg/dL (ref 0.0–40.0)

## 2017-11-23 LAB — COMPREHENSIVE METABOLIC PANEL
ALT: 52 U/L — AB (ref 0–35)
AST: 59 U/L — ABNORMAL HIGH (ref 0–37)
Albumin: 4 g/dL (ref 3.5–5.2)
Alkaline Phosphatase: 87 U/L (ref 39–117)
BILIRUBIN TOTAL: 0.3 mg/dL (ref 0.2–1.2)
BUN: 15 mg/dL (ref 6–23)
CALCIUM: 9.2 mg/dL (ref 8.4–10.5)
CHLORIDE: 104 meq/L (ref 96–112)
CO2: 30 meq/L (ref 19–32)
Creatinine, Ser: 1.33 mg/dL — ABNORMAL HIGH (ref 0.40–1.20)
GFR: 43.99 mL/min — AB (ref >60.00)
Glucose, Bld: 111 mg/dL — ABNORMAL HIGH (ref 70–99)
POTASSIUM: 4.8 meq/L (ref 3.5–5.1)
Sodium: 142 mEq/L (ref 135–145)
Total Protein: 6.8 g/dL (ref 6.0–8.3)

## 2017-11-23 LAB — HEMOGLOBIN A1C: Hgb A1c MFr Bld: 6.4 % (ref 4.6–6.5)

## 2017-11-26 ENCOUNTER — Other Ambulatory Visit: Payer: Self-pay | Admitting: Family Medicine

## 2017-11-27 DIAGNOSIS — C649 Malignant neoplasm of unspecified kidney, except renal pelvis: Secondary | ICD-10-CM | POA: Diagnosis not present

## 2017-11-27 DIAGNOSIS — C642 Malignant neoplasm of left kidney, except renal pelvis: Secondary | ICD-10-CM | POA: Diagnosis not present

## 2017-11-30 ENCOUNTER — Encounter: Payer: Self-pay | Admitting: Family Medicine

## 2017-11-30 ENCOUNTER — Ambulatory Visit (INDEPENDENT_AMBULATORY_CARE_PROVIDER_SITE_OTHER): Payer: BLUE CROSS/BLUE SHIELD | Admitting: Family Medicine

## 2017-11-30 VITALS — BP 104/72 | HR 77 | Temp 98.4°F | Ht 66.0 in | Wt 233.2 lb

## 2017-11-30 DIAGNOSIS — Z789 Other specified health status: Secondary | ICD-10-CM

## 2017-11-30 DIAGNOSIS — Z0000 Encounter for general adult medical examination without abnormal findings: Secondary | ICD-10-CM

## 2017-11-30 DIAGNOSIS — K219 Gastro-esophageal reflux disease without esophagitis: Secondary | ICD-10-CM

## 2017-11-30 DIAGNOSIS — Z7189 Other specified counseling: Secondary | ICD-10-CM

## 2017-11-30 DIAGNOSIS — I779 Disorder of arteries and arterioles, unspecified: Secondary | ICD-10-CM

## 2017-11-30 DIAGNOSIS — R251 Tremor, unspecified: Secondary | ICD-10-CM

## 2017-11-30 DIAGNOSIS — Z7289 Other problems related to lifestyle: Secondary | ICD-10-CM

## 2017-11-30 DIAGNOSIS — Z905 Acquired absence of kidney: Secondary | ICD-10-CM

## 2017-11-30 DIAGNOSIS — R945 Abnormal results of liver function studies: Secondary | ICD-10-CM

## 2017-11-30 DIAGNOSIS — F411 Generalized anxiety disorder: Secondary | ICD-10-CM

## 2017-11-30 DIAGNOSIS — I10 Essential (primary) hypertension: Secondary | ICD-10-CM

## 2017-11-30 DIAGNOSIS — R7989 Other specified abnormal findings of blood chemistry: Secondary | ICD-10-CM

## 2017-11-30 MED ORDER — PANTOPRAZOLE SODIUM 40 MG PO TBEC: 40.0000 mg | DELAYED_RELEASE_TABLET | Freq: Every day | ORAL | 3 refills | Status: DC

## 2017-11-30 MED ORDER — SERTRALINE HCL 100 MG PO TABS: 50.0000 mg | ORAL_TABLET | Freq: Every day | ORAL | 1 refills | Status: DC

## 2017-11-30 MED ORDER — METOPROLOL SUCCINATE ER 25 MG PO TB24: 25.0000 mg | ORAL_TABLET | Freq: Every day | ORAL | 3 refills | Status: DC

## 2017-11-30 MED ORDER — LISINOPRIL 20 MG PO TABS: 20.0000 mg | ORAL_TABLET | Freq: Every day | ORAL | 3 refills | Status: DC

## 2017-11-30 NOTE — Patient Instructions (Addendum)
You can call for a mammogram at [facility] [place] [phone]  Call the GI clinic at [phone] to schedule a visit.  Ask about upper and lower evaluation, given the heartburn and since your already due for a colonoscopy.  Ask them about your liver tests.    Recheck labs in about 2 months.  You don't have to fast.  You need a lab visit.    We will call about your referral for the echo to check your heart and aorta.  Rosaria Ferries or Azalee Course will call you if you don't see one of them on the way out.   Take care.  Glad to see you.  I would get a flu shot each fall.

## 2017-11-30 NOTE — Progress Notes (Signed)
CPE- See plan.  Routine anticipatory guidance given to patient.  See health maintenance.  The possibility exists that previously documented standard health maintenance information may have been brought forward from a previous encounter into this note.  If needed, that same information has been updated to reflect the current situation based on today's encounter.    Tetanus 2013 Flu shot- encouraged Tobacco and etoh discussed, precontemplative on both.  "I like smoking."   Living will, encouraged. Husband designated if incapacitated.  Mammogram due, declined.  D/w pt.  See avs.    DXA not due.  Pap 2014- declined 2019.  No discharge, no bleeding.  No hot flashes but some sweats attributed to menopause.   Colonoscopy 2014, due, d/w pt.  see avs.   Diet and exercise- d/w pt. Encouraged both.  [facility] report, so HIV and HCV screening would have been done at that point.   Head and L>R hand tremor at baseline, going on for years.  D/w pt about cutting back on caffeine to see if that would help.  3-4 drinks of caffeine a day.    D/w pt about f/u echo to check aorta.  She consented for f/u study.  Ordered.    She has a knot on her abd scar that she wanted to get checked.    LFT elevation.  D/w pt about cutting back etoh.  Labs d/w pt.  No jaundice.  No vomiting.    Hypertension:    Using medication without problems or lightheadedness:  yes Chest pain with exertion:no Edema:no Short of breath:no Labs d/w pt.    Mood d/w pt.  H/o anxiety.  D/w pt about smoking and etoh.  "I'm pretty calm."  Still on SSRI.  She wasn't anxious while on med.  No Si/Hi.    Still on PPI.  Some constipation.  She had some likely hemorrhoid irritation.  D/w pt about etoh and smoking, advised to taper both.   Note routed to GI as FYI.   She is putting up with plantar fasciitis and trying to wear good shoes.    H/o nephrectomy.  She is going to go see urology soon.   Cr d/w pt.  Similar to prev.    PMH and SH reviewed  Meds, vitals, and allergies reviewed.   ROS: Per HPI.  Unless specifically indicated otherwise in HPI, the patient denies:  General: fever. Eyes: acute vision changes ENT: sore throat Cardiovascular: chest pain Respiratory: SOB GI: vomiting GU: dysuria Musculoskeletal: acute back pain Derm: acute rash Neuro: acute motor dysfunction Psych: worsening mood Endocrine: polydipsia Heme: bleeding Allergy: hayfever  GEN: nad, alert and oriented HEENT: mucous membranes moist NECK: supple w/o LA CV: rrr. PULM: ctab, no inc wob ABD: soft, +bs, she likely has a small abdominal hernia, soft, nontender. EXT: no edema SKIN: no acute rash but with lipoma noted on the right upper back.

## 2017-12-03 DIAGNOSIS — Z905 Acquired absence of kidney: Secondary | ICD-10-CM | POA: Diagnosis not present

## 2017-12-03 DIAGNOSIS — C642 Malignant neoplasm of left kidney, except renal pelvis: Secondary | ICD-10-CM | POA: Diagnosis not present

## 2017-12-03 DIAGNOSIS — K219 Gastro-esophageal reflux disease without esophagitis: Secondary | ICD-10-CM | POA: Insufficient documentation

## 2017-12-03 DIAGNOSIS — Z7189 Other specified counseling: Secondary | ICD-10-CM | POA: Insufficient documentation

## 2017-12-03 DIAGNOSIS — R251 Tremor, unspecified: Secondary | ICD-10-CM | POA: Insufficient documentation

## 2017-12-03 NOTE — Assessment & Plan Note (Signed)
D/w pt about smoking and etoh.  "I'm pretty calm."  Still on SSRI.  She wasn't anxious while on med.  No Si/Hi.   Continue SSRI.  Okay for outpatient follow-up.

## 2017-12-03 NOTE — Assessment & Plan Note (Signed)
Continue PPI.  Discussed smoking and alcohol.  See above.

## 2017-12-03 NOTE — Assessment & Plan Note (Signed)
Living will, encouraged. Husband designated if incapacitated.  

## 2017-12-03 NOTE — Assessment & Plan Note (Signed)
Tetanus 2013 Flu shot- encouraged Tobacco and etoh discussed, precontemplative on both.  "I like smoking."   Living will, encouraged. Husband designated if incapacitated.  Mammogram due, declined.  D/w pt.  See avs.    DXA not due.  Pap 2014- declined 2019.  No discharge, no bleeding.  No hot flashes but some sweats attributed to menopause.   Colonoscopy 2014, due, d/w pt.  see avs.   Diet and exercise- d/w pt. Encouraged both.  [facility] report, so HIV and HCV screening would have been done at that point.

## 2017-12-03 NOTE — Assessment & Plan Note (Signed)
At baseline.  Observe, discussed with patient about cutting back on caffeine.

## 2017-12-03 NOTE — Assessment & Plan Note (Signed)
D/w pt about f/u echo to check aorta.  She consented for f/u study.  Ordered.

## 2017-12-03 NOTE — Assessment & Plan Note (Addendum)
Encourage tapering and cessation, especially given her LFT elevation.  She is at risk for cirrhosis, etc.  Discussed.  Recheck LFTs periodically.  See after visit summary.

## 2017-12-03 NOTE — Assessment & Plan Note (Signed)
H/o nephrectomy.  She is going to go see urology soon.  Cr d/w pt.  Similar to prev.

## 2017-12-03 NOTE — Assessment & Plan Note (Signed)
No change in meds.  Discussed smoking cessation, alcohol taper, etc.

## 2017-12-08 ENCOUNTER — Telehealth: Payer: Self-pay | Admitting: *Deleted

## 2017-12-08 NOTE — Telephone Encounter (Signed)
-----   Message from Larina Bras, [place] sent at 12/03/2017  1:59 PM EDT ----- We currently have no openings or a schedule available for October. I will look again in a  couple days. ----- Message ----- From: Jerene Bears, MD Sent: 12/03/2017   8:36 AM To: Larina Bras, CMA  Pt needs followup visit (nonurgent) She is overdue colon surveillance and per PCP has elevated LFTs Thanks JMP  ----- Message ----- From: Tonia Ghent, MD Sent: 12/03/2017   7:42 AM To: Jerene Bears, MD

## 2017-12-08 NOTE — Telephone Encounter (Signed)
Left voicemail for patient to call back. 

## 2017-12-09 NOTE — Telephone Encounter (Signed)
Left voicemail for patient to call back. 

## 2017-12-10 NOTE — Telephone Encounter (Signed)
Patient called back and is scheduled with Dr. Hilarie Fredrickson  for 02-23-18 per her choice. I offered something sooner, but she stated she may be on vacation the first part of October and would rather schedule towards the end.

## 2017-12-11 ENCOUNTER — Ambulatory Visit (HOSPITAL_COMMUNITY): Payer: BLUE CROSS/BLUE SHIELD | Attending: Internal Medicine

## 2017-12-11 ENCOUNTER — Other Ambulatory Visit: Payer: Self-pay

## 2017-12-11 DIAGNOSIS — I779 Disorder of arteries and arterioles, unspecified: Secondary | ICD-10-CM

## 2017-12-11 DIAGNOSIS — I10 Essential (primary) hypertension: Secondary | ICD-10-CM | POA: Diagnosis not present

## 2017-12-11 DIAGNOSIS — Z6837 Body mass index (BMI) 37.0-37.9, adult: Secondary | ICD-10-CM | POA: Diagnosis not present

## 2017-12-11 DIAGNOSIS — E669 Obesity, unspecified: Secondary | ICD-10-CM | POA: Insufficient documentation

## 2017-12-13 ENCOUNTER — Other Ambulatory Visit: Payer: Self-pay | Admitting: Family Medicine

## 2017-12-13 DIAGNOSIS — I517 Cardiomegaly: Secondary | ICD-10-CM

## 2018-01-15 ENCOUNTER — Ambulatory Visit: Payer: BLUE CROSS/BLUE SHIELD | Admitting: Cardiovascular Disease

## 2018-01-15 ENCOUNTER — Encounter: Payer: Self-pay | Admitting: Cardiovascular Disease

## 2018-01-15 VITALS — BP 126/80 | HR 73 | Ht 67.0 in | Wt 229.4 lb

## 2018-01-15 DIAGNOSIS — R0681 Apnea, not elsewhere classified: Secondary | ICD-10-CM | POA: Diagnosis not present

## 2018-01-15 DIAGNOSIS — I10 Essential (primary) hypertension: Secondary | ICD-10-CM

## 2018-01-15 DIAGNOSIS — R0602 Shortness of breath: Secondary | ICD-10-CM | POA: Diagnosis not present

## 2018-01-15 NOTE — Patient Instructions (Signed)
Medication Instructions:  Your physician recommends that you continue on your current medications as directed. Please refer to the Current Medication list given to you today.  Labwork: NONE  Testing/Procedures: Your physician has recommended that you have a sleep study. This test records several body functions during sleep, including: brain activity, eye movement, oxygen and carbon dioxide blood levels, heart rate and rhythm, breathing rate and rhythm, the flow of air through your mouth and nose, snoring, body muscle movements, and chest and belly movement. THE OFFICE WILL CALL YOU ONCE THIS AS BEEN APPROVED BY INSURANCE TO GET SCHEDULED   Follow-Up: AS NEEDED

## 2018-01-15 NOTE — Progress Notes (Signed)
Cardiology Office Note   Date:  01/21/2018   ID:  Nichole Burke, DOB 1960/05/19, MRN [id number]  PCP:  Tonia Ghent, MD  Cardiologist:   Skeet Latch, MD   No chief complaint on file.     History of Present Illness: Nichole Burke is a 57 y.o. female with hypertension and RCC s/p nephrectomy, who is being seen today for Nichole evaluation of severe LVH at Nichole request of Tonia Ghent, MD.  Nichole Burke saw Dr. Damita Dunnings on 11/2017.  Nichole Burke had concern for a aortic aneurysm and was referred for an echocardiogram.  Echo performed 12/11/2017 revealed LVEF 65 to 70% with severe LVH and grade 1 diastolic dysfunction.  Nichole Burke aorta was normal in size.  Nichole Burke was referred to cardiology for further evaluation.  Overall Nichole Burke has felt well.  Nichole Burke has no chest pain but does have exertional dyspnea.  Nichole Burke does not exercise due to chronic back and leg pain.  Nichole Burke does sometimes have lower extremity edema that improves with elevation of Nichole Burke legs.  Nichole Burke has no orthopnea or PND.  Nichole Burke sometimes feels lightheaded when walking but has no syncope or presyncope.  Nichole Burke denies palpitations Nichole Burke blood pressure has been well-controlled.  Nichole Burke reports that "I eat what I want."  Nichole Burke does not follow any particular diet.  Nichole Burke father had a heart attack in his 16s and Nichole Burke mother in Nichole Burke 44s.  Nichole Burke currently smokes 1 pack of cigarettes daily and has no desire to quit.  Nichole Burke has never been able to quit in Nichole past.  Nichole Burke does snore loudly.  Nichole Burke husband notes that Nichole Burke frequently stops breathing when Nichole Burke tries to sleep.  Nichole Burke does not feel well-rested when Nichole Burke wakes up in Nichole morning and Nichole Burke falls asleep easily during Nichole day.   Past Medical History:  Diagnosis Date  . Anxiety   . GERD (gastroesophageal reflux disease)   . History of hiatal hernia   . HTN (hypertension)   . Plantar fasciitis    Right  . Pure hypercholesterolemia   . Sleep apnea    in home sleep study; OSA; pt does not have CPAP  . Tobacco abuse   . Tremor   . Tubular  adenoma of colon   . Wears glasses     Past Surgical History:  Procedure Laterality Date  . LIPOMA EXCISION N/A 10/31/2015   Procedure: OPEN EXCISION LIPOMA;  Surgeon: Alexis Frock, MD;  Location: [facility];  Service: Urology;  Laterality: N/A;  . PELVIC LYMPH NODE DISSECTION Bilateral 10/31/2015   Procedure: PELVIC LYMPH NODE DISSECTION;  Surgeon: Alexis Frock, MD;  Location: [facility];  Service: Urology;  Laterality: Bilateral;  . ROBOT ASSISTED LAPAROSCOPIC NEPHRECTOMY Left 10/31/2015   Procedure: XI ROBOTIC ASSISTED LAPAROSCOPIC NEPHRECTOMY;  Surgeon: Alexis Frock, MD;  Location: [facility];  Service: Urology;  Laterality: Left;  . TUBAL LIGATION  1995   after pregnancy with blighted ovum     Current Outpatient Medications  Medication Sig Dispense Refill  . acetaminophen (TYLENOL) 325 MG tablet Take 650 mg by mouth every 6 (six) hours as needed.    Marland Kitchen aspirin EC 81 MG tablet Take 81 mg by mouth daily.    Marland Kitchen lisinopril (PRINIVIL,ZESTRIL) 20 MG tablet Take 1 tablet (20 mg total) by mouth daily. 90 tablet 3  . metoprolol succinate (TOPROL-XL) 25 MG 24 hr tablet Take 1 tablet (25 mg total) by mouth daily. 90 tablet 3  . Multiple Vitamin (MULTIVITAMIN) tablet Take  1 tablet by mouth daily.    . pantoprazole (PROTONIX) 40 MG tablet Take 1 tablet (40 mg total) by mouth daily. 90 tablet 3  . sertraline (ZOLOFT) 100 MG tablet Take 0.5 tablets (50 mg total) by mouth daily. 90 tablet 1   No current facility-administered medications for this visit.     Allergies:   Aleve [naproxen sodium]; Ibuprofen; and Nsaids    Social History:  Nichole Burke  reports that Nichole Burke has been smoking cigarettes. Nichole Burke has a 30.00 pack-year smoking history. Nichole Burke has never used smokeless tobacco. Nichole Burke reports that Nichole Burke drinks about 3.0 standard drinks of alcohol per week. Nichole Burke reports that Nichole Burke does not use drugs.   Family History:  Nichole Burke's family history includes Coronary artery disease in Nichole Burke father and mother; Diabetes in  Nichole Burke brother and mother; Heart attack in Nichole Burke mother; Heart attack (age of onset: 97) in Nichole Burke father; Heart disease in Nichole Burke father and mother; Kidney cancer in Nichole Burke brother.    ROS:  Please see Nichole history of present illness.   Otherwise, review of systems are positive for none.   All other systems are reviewed and negative.    PHYSICAL EXAM: VS:  BP 126/80 (BP Location: Right Arm)   Pulse 73   Ht 5\' 7"  (1.702 m)   Wt 229 lb 6.4 oz (104.1 kg)   LMP 01/19/2013   BMI 35.93 kg/m  , BMI Body mass index is 35.93 kg/m. GENERAL:  Well appearing HEENT:  Pupils equal round and reactive, fundi not visualized, oral mucosa unremarkable NECK:  No jugular venous distention, waveform within normal limits, carotid upstroke brisk and symmetric, no bruits, no thyromegaly LYMPHATICS:  No cervical adenopathy LUNGS:  Clear to auscultation bilaterally HEART:  RRR.  PMI not displaced or sustained,S1 and S2 within normal limits, no S3, no S4, no clicks, no rubs, no murmurs ABD:  Flat, positive bowel sounds normal in frequency in pitch, no bruits, no rebound, no guarding, no midline pulsatile mass, no hepatomegaly, no splenomegaly EXT:  2 plus pulses throughout, no edema, no cyanosis no clubbing SKIN:  No rashes no nodules NEURO:  Cranial nerves II through XII grossly intact, motor grossly intact throughout PSYCH:  Cognitively intact, oriented to person place and time   EKG:  EKG is ordered today. Nichole ekg ordered today demonstrates sinus rhythm.  Rate 73 bpm.  LAD.   Echo 12/11/17: Study Conclusions  - Left ventricle: Nichole cavity size was normal. Wall thickness was   increased in a pattern of severe LVH. Systolic function was   vigorous. Nichole estimated ejection fraction was in Nichole range of 65%   to 70%. Wall motion was normal; there were no regional wall   motion abnormalities. Doppler parameters are consistent with   abnormal left ventricular relaxation (grade 1 diastolic   dysfunction). - Right atrium: Nichole  atrium was mildly dilated. - Pericardium, extracardiac: A trivial pericardial effusion was   identified.  Recent Labs: 11/23/2017: ALT 52; BUN 15; Creatinine, Ser 1.33; Potassium 4.8; Sodium 142    Lipid Panel    Component Value Date/Time   CHOL 185 11/23/2017 0805   TRIG 167.0 (H) 11/23/2017 0805   HDL 36.30 (L) 11/23/2017 0805   CHOLHDL 5 11/23/2017 0805   VLDL 33.4 11/23/2017 0805   LDLCALC 115 (H) 11/23/2017 0805      Wt Readings from Last 3 Encounters:  01/15/18 229 lb 6.4 oz (104.1 kg)  11/30/17 233 lb 4 oz (105.8 kg)  11/25/16 231  lb 12 oz (105.1 kg)      ASSESSMENT AND PLAN:  # Severe LVH: I have personally reviewed Nichole Burke echo.  Nichole Burke has mild-moderate LVH.  Nichole Burke has no murmur or evidence of outflow obstruction.  Nichole Burke degree of LVH is not surprising given Nichole Burke hypertension.  No further evaluation needed.  This is not consistent with hypertrophic or myopathy.  # Hypertension: BP controlled.  Continue metoprolol and lisinopril.  # Likely OSA:  Nichole Burke likely has sleep apnea.  Nichole Burke snores, has apnea and daytime somnolence.  Epworth sleepiness scale was 16.  We will refer Nichole Burke for a sleep study.  # Morbid obesity: # Family history of CAD: # CV Disease Prevention: Nichole Burke and I had a frank discussion about Nichole Burke overall health.  Right now Nichole Burke does not need a cardiologist but Nichole Burke is very likely to need one in Nichole future Nichole Burke does not make any changes.  Nichole Burke has a significant family history of premature coronary artery disease, continues to smoke, eats "whenever Nichole Burke wants," and has no desire to exercise.  I asked Nichole Burke to think about 2 things that Nichole Burke could change to help improve Nichole Burke lifestyle for Nichole better.  Nichole Burke will think about this.    Current medicines are reviewed at length with Nichole Burke today.  Nichole Burke does not have concerns regarding medicines.  Nichole following changes have been made:  no change  Labs/ tests ordered today include:   Orders Placed This Encounter   Procedures  . Split night study     Disposition:   FU with Miliyah Luper C. Oval Linsey, MD, [facility] as needed.    Signed, Kobe Ofallon C. Oval Linsey, MD, [facility]  01/21/2018 11:03 PM    [facility]

## 2018-01-21 ENCOUNTER — Encounter: Payer: Self-pay | Admitting: Cardiovascular Disease

## 2018-01-22 ENCOUNTER — Telehealth: Payer: Self-pay | Admitting: *Deleted

## 2018-01-22 ENCOUNTER — Other Ambulatory Visit: Payer: Self-pay | Admitting: Cardiovascular Disease

## 2018-01-22 DIAGNOSIS — G4733 Obstructive sleep apnea (adult) (pediatric): Secondary | ICD-10-CM

## 2018-01-22 DIAGNOSIS — I10 Essential (primary) hypertension: Secondary | ICD-10-CM

## 2018-01-22 NOTE — Telephone Encounter (Signed)
Left message to return a call to me. 

## 2018-01-25 NOTE — Telephone Encounter (Signed)
Patient returned a call to me and was given her HST appointment details and [facility] contact information.

## 2018-02-01 ENCOUNTER — Other Ambulatory Visit: Payer: BLUE CROSS/BLUE SHIELD

## 2018-02-04 ENCOUNTER — Encounter: Payer: Self-pay | Admitting: *Deleted

## 2018-02-08 ENCOUNTER — Other Ambulatory Visit (INDEPENDENT_AMBULATORY_CARE_PROVIDER_SITE_OTHER): Payer: BLUE CROSS/BLUE SHIELD

## 2018-02-08 DIAGNOSIS — R7989 Other specified abnormal findings of blood chemistry: Secondary | ICD-10-CM

## 2018-02-08 DIAGNOSIS — R945 Abnormal results of liver function studies: Secondary | ICD-10-CM

## 2018-02-08 LAB — COMPREHENSIVE METABOLIC PANEL
ALT: 36 U/L — AB (ref 0–35)
AST: 32 U/L (ref 0–37)
Albumin: 3.9 g/dL (ref 3.5–5.2)
Alkaline Phosphatase: 93 U/L (ref 39–117)
BILIRUBIN TOTAL: 0.3 mg/dL (ref 0.2–1.2)
BUN: 17 mg/dL (ref 6–23)
CHLORIDE: 105 meq/L (ref 96–112)
CO2: 30 meq/L (ref 19–32)
CREATININE: 1.62 mg/dL — AB (ref 0.40–1.20)
Calcium: 9 mg/dL (ref 8.4–10.5)
GFR: 35.01 mL/min — ABNORMAL LOW (ref >60.00)
Glucose, Bld: 144 mg/dL — ABNORMAL HIGH (ref 70–99)
Potassium: 4.3 mEq/L (ref 3.5–5.1)
Sodium: 141 mEq/L (ref 135–145)
Total Protein: 6.3 g/dL (ref 6.0–8.3)

## 2018-02-10 ENCOUNTER — Encounter (HOSPITAL_BASED_OUTPATIENT_CLINIC_OR_DEPARTMENT_OTHER): Payer: BLUE CROSS/BLUE SHIELD

## 2018-02-12 ENCOUNTER — Ambulatory Visit (HOSPITAL_BASED_OUTPATIENT_CLINIC_OR_DEPARTMENT_OTHER): Payer: BLUE CROSS/BLUE SHIELD | Attending: Cardiovascular Disease | Admitting: Cardiovascular Disease

## 2018-02-12 DIAGNOSIS — G4736 Sleep related hypoventilation in conditions classified elsewhere: Secondary | ICD-10-CM | POA: Insufficient documentation

## 2018-02-12 DIAGNOSIS — G4733 Obstructive sleep apnea (adult) (pediatric): Secondary | ICD-10-CM

## 2018-02-12 DIAGNOSIS — I10 Essential (primary) hypertension: Secondary | ICD-10-CM

## 2018-02-14 ENCOUNTER — Other Ambulatory Visit: Payer: Self-pay | Admitting: Family Medicine

## 2018-02-14 DIAGNOSIS — Z905 Acquired absence of kidney: Secondary | ICD-10-CM

## 2018-02-18 ENCOUNTER — Encounter (INDEPENDENT_AMBULATORY_CARE_PROVIDER_SITE_OTHER): Payer: Self-pay

## 2018-02-18 ENCOUNTER — Encounter: Payer: Self-pay | Admitting: Family Medicine

## 2018-02-18 ENCOUNTER — Ambulatory Visit: Payer: BLUE CROSS/BLUE SHIELD | Admitting: Family Medicine

## 2018-02-18 DIAGNOSIS — Z905 Acquired absence of kidney: Secondary | ICD-10-CM | POA: Diagnosis not present

## 2018-02-18 LAB — BASIC METABOLIC PANEL
BUN: 22 mg/dL (ref 6–23)
CHLORIDE: 104 meq/L (ref 96–112)
CO2: 30 mEq/L (ref 19–32)
Calcium: 9.8 mg/dL (ref 8.4–10.5)
Creatinine, Ser: 1.35 mg/dL — ABNORMAL HIGH (ref 0.40–1.20)
GFR: 43.2 mL/min — ABNORMAL LOW (ref >60.00)
Glucose, Bld: 106 mg/dL — ABNORMAL HIGH (ref 70–99)
POTASSIUM: 4.5 meq/L (ref 3.5–5.1)
SODIUM: 140 meq/L (ref 135–145)

## 2018-02-18 NOTE — Progress Notes (Signed)
Sleep study done last week.  I am awaiting the final report.  Discussed with patient.  She had 2 skin lesions that she wanted evaluated.  One is on the right side of the neck, it looks like an irritated skin tag that is hyperpigmented.  She also has a skin tag on the right triceps area.    Creatinine elevation.  She does not use NSAIDs.  She still on ACE inhibitor.  Compliant with baseline medications.  Recheck labs are pending.  No blood in urine.  We talked about creatinine labs in general and monitoring her kidney function.  History of nephrectomy noted.  We talked about smoking cessation, etc.  Meds, vitals, and allergies reviewed.   ROS: Per HPI unless specifically indicated in ROS section   GEN: nad, alert and oriented NECK: supple w/o LA CV: rrr. PULM: ctab, no inc wob ABD: soft, +bs EXT: no edema SKIN: no acute rash but skin tag noted on the right triceps area.  She also has a hyperpigmented skin tag noted on the right side of the neck.

## 2018-02-18 NOTE — Patient Instructions (Addendum)
I'll await the final sleep report.   We'll contact you with your lab report.  Don't change your meds for now.  Avoid ibuprofen and aleve.  Drink plenty of water.   Your creatinine was previously higher than expected.  The higher the number, the worse your kidney function.  Let me get the results from today and we'll be in touch.   Take care.  Glad to see you.

## 2018-02-19 ENCOUNTER — Telehealth: Payer: Self-pay | Admitting: Family Medicine

## 2018-02-19 NOTE — Telephone Encounter (Signed)
Pt. returned call to obtain lab results.  Was advised per the Agent, that she reached out to the [place] at Dr. Josefine Class office, and was given verbal okay for the Triage nurse to give lab results to pt.  Advised of result note per Dr. Damita Dunnings on 02/19/18.  Verb understanding of results and instructions.  Stated she will wait to schedule the procedure appt. to have skin tags removed.  (results documented in phone note, as result note was not forwarded to [facility])

## 2018-02-19 NOTE — Assessment & Plan Note (Signed)
See notes on labs.  NSAID cautions discussed with patient.  It may be the case that she was not well-hydrated when her labs were collected previously.  If her creatinine is improved and she may not need other specific intervention at this point.  We talked about smoking cessation, etc.  We can work on the skin tags later on.  I am awaiting sleep study report.

## 2018-02-23 ENCOUNTER — Ambulatory Visit: Payer: BLUE CROSS/BLUE SHIELD | Admitting: Internal Medicine

## 2018-02-23 ENCOUNTER — Encounter: Payer: Self-pay | Admitting: Internal Medicine

## 2018-02-23 ENCOUNTER — Encounter (INDEPENDENT_AMBULATORY_CARE_PROVIDER_SITE_OTHER): Payer: Self-pay

## 2018-02-23 VITALS — BP 118/78 | HR 79 | Ht 67.0 in | Wt 231.0 lb

## 2018-02-23 DIAGNOSIS — R748 Abnormal levels of other serum enzymes: Secondary | ICD-10-CM | POA: Diagnosis not present

## 2018-02-23 DIAGNOSIS — Z86010 Personal history of colonic polyps: Secondary | ICD-10-CM | POA: Diagnosis not present

## 2018-02-23 DIAGNOSIS — K219 Gastro-esophageal reflux disease without esophagitis: Secondary | ICD-10-CM | POA: Diagnosis not present

## 2018-02-23 DIAGNOSIS — K5900 Constipation, unspecified: Secondary | ICD-10-CM

## 2018-02-23 MED ORDER — SUPREP BOWEL PREP KIT 17.5-3.13-1.6 GM/177ML PO SOLN: 1.0000 | ORAL | 0 refills | Status: DC

## 2018-02-23 NOTE — Progress Notes (Signed)
Patient ID: Nichole Burke, female   DOB: 02/18/1961, 57 y.o.   MRN: [id number] HPI: Nichole Burke is a 57 year old female with a history of adenomatous colon polyp in 2014, GERD, hypertension, hypercholesterolemia, sleep apnea, tobacco use who is seen in consultation at the request of Dr. Damita Dunnings to evaluate heartburn symptoms but also discussed polyp surveillance.  She is here alone today.  She had a colonoscopy 5 years ago in November 2014 revealing a 12 mm tubular adenoma which was pedunculated and removed from the sigmoid.  The exam was otherwise normal with the exception of internal hemorrhoids seen on retroflexion.  3-year recall was recommended at that time.  She reports her bowel movements have been mostly regular though at times she can skip a day and then it feels somewhat hard to go to the bathroom.  She is felt some incomplete evacuation and feels like she has to wipe excessively to get clean.  No blood in her stool or melena.  She is struggling with nighttime reflux symptoms including coughing, waking up at times gasping for breath, water brash at night and at times vomiting.  No dysphagia or odynophagia.  No abdominal pain.  She is using pantoprazole 40 mg once daily.  Recently her liver enzymes were found to be elevated by primary care but they had normalized on recheck.  She does drink alcohol.  Past Medical History:  Diagnosis Date  . Anxiety   . Elevated LFTs   . GERD (gastroesophageal reflux disease)   . History of hiatal hernia   . HTN (hypertension)   . Plantar fasciitis    Right  . Pure hypercholesterolemia   . Sleep apnea    in home sleep study; OSA; pt does not have CPAP  . Tobacco abuse   . Tremor   . Tubular adenoma of colon   . Wears glasses     Past Surgical History:  Procedure Laterality Date  . LIPOMA EXCISION N/A 10/31/2015   Procedure: OPEN EXCISION LIPOMA;  Surgeon: Alexis Frock, MD;  Location: [facility];  Service: Urology;  Laterality: N/A;  . PELVIC  LYMPH NODE DISSECTION Bilateral 10/31/2015   Procedure: PELVIC LYMPH NODE DISSECTION;  Surgeon: Alexis Frock, MD;  Location: [facility];  Service: Urology;  Laterality: Bilateral;  . ROBOT ASSISTED LAPAROSCOPIC NEPHRECTOMY Left 10/31/2015   Procedure: XI ROBOTIC ASSISTED LAPAROSCOPIC NEPHRECTOMY;  Surgeon: Alexis Frock, MD;  Location: [facility];  Service: Urology;  Laterality: Left;  . TUBAL LIGATION  1995   after pregnancy with blighted ovum    Outpatient Medications Prior to Visit  Medication Sig Dispense Refill  . acetaminophen (TYLENOL) 325 MG tablet Take 650 mg by mouth every 6 (six) hours as needed.    Marland Kitchen aspirin EC 81 MG tablet Take 81 mg by mouth daily.    Marland Kitchen lisinopril (PRINIVIL,ZESTRIL) 20 MG tablet Take 1 tablet (20 mg total) by mouth daily. 90 tablet 3  . metoprolol succinate (TOPROL-XL) 25 MG 24 hr tablet Take 1 tablet (25 mg total) by mouth daily. 90 tablet 3  . Multiple Vitamin (MULTIVITAMIN) tablet Take 1 tablet by mouth daily.    . pantoprazole (PROTONIX) 40 MG tablet Take 1 tablet (40 mg total) by mouth daily. 90 tablet 3  . sertraline (ZOLOFT) 100 MG tablet Take 0.5 tablets (50 mg total) by mouth daily. 90 tablet 1   No facility-administered medications prior to visit.     Allergies  Allergen Reactions  . Aleve [Naproxen Sodium] Other (See Comments)  H/o nephrectomy   . Ibuprofen Other (See Comments)    H/o nephrectomy   . Nsaids Other (See Comments)    H/o nephrectomy     Family History  Problem Relation Age of Onset  . Heart attack Father 55  . Heart disease Father   . Coronary artery disease Father   . Heart attack Mother   . Coronary artery disease Mother   . Diabetes Mother   . Heart disease Mother   . Diabetes Brother   . Kidney cancer Brother   . Breast cancer Neg Hx   . Colon cancer Neg Hx   . Colon polyps Neg Hx   . Rectal cancer Neg Hx   . Stomach cancer Neg Hx     Social History   Tobacco Use  . Smoking status: Current Every Day Smoker     Packs/day: 1.00    Years: 30.00    Pack years: 30.00    Types: Cigarettes  . Smokeless tobacco: Never Used  Substance Use Topics  . Alcohol use: Yes    Alcohol/week: 3.0 standard drinks    Types: 3 Standard drinks or equivalent per week    Comment: 5th per week.    . Drug use: No    ROS: As per history of present illness, otherwise negative  BP 118/78   Pulse 79   Ht 5\' 7"  (1.702 m)   Wt 231 lb (104.8 kg)   LMP 01/19/2013   BMI 36.18 kg/m  Constitutional: Well-developed and well-nourished. No distress. HEENT: Normocephalic and atraumatic.  Conjunctivae are normal.  No scleral icterus. Neck: Neck supple. Trachea midline. Cardiovascular: Normal rate, regular rhythm and intact distal pulses. No M/R/G Pulmonary/chest: Effort normal and breath sounds normal. No wheezing, rales or rhonchi. Abdominal: Soft, obese, nontender, nondistended. Bowel sounds active throughout.  Extremities: no clubbing, cyanosis, or edema Neurological: Alert and oriented to person place and time. Skin: Skin is warm and dry.  Psychiatric: Normal mood and affect. Behavior is normal.  RELEVANT LABS AND IMAGING: CBC    Component Value Date/Time   WBC 10.7 (H) 10/24/2015 1140   RBC 4.74 10/24/2015 1140   HGB 13.1 11/01/2015 0504   HCT 40.9 11/01/2015 0504   PLT 156 10/24/2015 1140   MCV 92.6 10/24/2015 1140   MCH 30.0 10/24/2015 1140   MCHC 32.3 10/24/2015 1140   RDW 14.9 10/24/2015 1140   LYMPHSABS 2.9 05/17/2014 0959   MONOABS 0.5 05/17/2014 0959   EOSABS 0.1 05/17/2014 0959   BASOSABS 0.1 05/17/2014 0959    CMP     Component Value Date/Time   NA 140 02/18/2018 1322   K 4.5 02/18/2018 1322   CL 104 02/18/2018 1322   CO2 30 02/18/2018 1322   GLUCOSE 106 (H) 02/18/2018 1322   BUN 22 02/18/2018 1322   CREATININE 1.35 (H) 02/18/2018 1322   CALCIUM 9.8 02/18/2018 1322   PROT 6.3 02/08/2018 1101   ALBUMIN 3.9 02/08/2018 1101   AST 32 02/08/2018 1101   ALT 36 (H) 02/08/2018 1101   ALKPHOS  93 02/08/2018 1101   BILITOT 0.3 02/08/2018 1101   GFRNONAA 39 (L) 11/01/2015 0504   GFRAA 46 (L) 11/01/2015 0504    ASSESSMENT/PLAN: 57 year old female with a history of adenomatous colon polyp in 2014, GERD, hypertension, hypercholesterolemia, sleep apnea, tobacco use who is seen in consultation at the request of Dr. Damita Dunnings to evaluate heartburn symptoms but also discussed polyp surveillance.  1.  GERD --nocturnal symptoms despite daily pantoprazole.  I recommended  upper endoscopy.  We discussed the risk, benefits and alternatives and she is agreeable and wishes to proceed.  Continue pantoprazole 40 mg in the morning, 30 minutes before breakfast.  Add famotidine 20 mg at bedtime.  GERD diet and elevate head of bed for sleeping.  2.  History of adenomatous colon polyp --1.2 cm adenoma nearly 5 years ago.  Surveillance colonoscopy recommended at this time.  We discussed the risk, benefits and alternatives and she is agreeable and wishes to proceed.  3.  Mild constipation/incomplete evacuation --add Benefiber 1 working to 2 tablespoons daily.  Colonoscopy as above  4.  History of elevated liver enzymes --liver enzymes were elevated earlier this year but had normalized on recheck.  I recommend these be followed to ensure they remain normal.  If they are again elevated I would recommend evaluation for causes of liver inflammation including fatty liver disease/NASH.   EL:TRVUYE, Elveria Rising, Md [place], [place] [address]

## 2018-02-23 NOTE — Addendum Note (Signed)
Addended by: Jerene Bears on: 02/23/2018 02:44 PM   Modules accepted: Level of Service

## 2018-02-23 NOTE — Patient Instructions (Addendum)
You have been scheduled for an endoscopy and colonoscopy. Please follow the written instructions given to you at your visit today. Please pick up your prep supplies at the pharmacy within the next 1-3 days. If you use inhalers (even only as needed), please bring them with you on the day of your procedure. Your physician has requested that you go to www.startemmi.com and enter the access code given to you at your visit today. This web site gives a general overview about your procedure. However, you should still follow specific instructions given to you by our office regarding your preparation for the procedure.  Please purchase the following medications over the counter and take as directed: Benefiber 1 tablespoon daily, working your way up to 2 tablespoon daily  Take pantoprazole 40 mg every morning.  Take pepcid 20 mg every night.  If you are age 108 or older, your body mass index should be between 23-30. Your Body mass index is 36.18 kg/m. If this is out of the aforementioned range listed, please consider follow up with your Primary Care Provider.  If you are age 70 or younger, your body mass index should be between 19-25. Your Body mass index is 36.18 kg/m. If this is out of the aformentioned range listed, please consider follow up with your Primary Care Provider.

## 2018-02-25 ENCOUNTER — Telehealth: Payer: Self-pay | Admitting: Internal Medicine

## 2018-02-25 NOTE — Telephone Encounter (Signed)
Pt states that copay for suprep is over $100, wants to know if there is a coupon that we can give her.

## 2018-02-25 NOTE — Telephone Encounter (Signed)
We do not have any coupons at this time, however, I have placed a suprep kit at the front desk for patient pick up. She verbalizes understanding.

## 2018-02-26 ENCOUNTER — Encounter (HOSPITAL_BASED_OUTPATIENT_CLINIC_OR_DEPARTMENT_OTHER): Payer: Self-pay | Admitting: Cardiovascular Disease

## 2018-02-26 NOTE — Procedures (Signed)
    Patient Name: Nichole Burke, [name] Date: 02/14/2018 Gender: Female D.O.B: 12-Dec-1960 Age (years): 57 Referring Provider: Skeet Latch Height (inches): 30 Interpreting Physician: Shelva Majestic MD, [facility] Weight (lbs): 233 RPSGT: Jacolyn Reedy BMI: 36 MRN: [id number] Neck Size: <br>  CLINICAL INFORMATION Sleep Study Type: HST  Indication for sleep study: N/A  Epworth Sleepiness Score: 18  SLEEP STUDY TECHNIQUE A multi-channel overnight portable sleep study was performed. The channels recorded were: nasal airflow, thoracic respiratory movement, and oxygen saturation with a pulse oximetry. Snoring was also monitored.  MEDICATIONS     acetaminophen (TYLENOL) 325 MG tablet         aspirin EC 81 MG tablet         lisinopril (PRINIVIL,ZESTRIL) 20 MG tablet         metoprolol succinate (TOPROL-XL) 25 MG 24 hr tablet         Multiple Vitamin (MULTIVITAMIN) tablet         pantoprazole (PROTONIX) 40 MG tablet         sertraline (ZOLOFT) 100 MG tablet      Patient self administered medications include: N/A.  SLEEP ARCHITECTURE Patient was studied for 380.7 minutes. The sleep efficiency was 99.2 % and the patient was supine for 83.3%. The arousal index was 0.0 per hour.  RESPIRATORY PARAMETERS The overall AHI was 41.6 per hour, with a central apnea index of 0.0 per hour.  The oxygen nadir was 86% during sleep.  CARDIAC DATA Mean heart rate during sleep was 66.3 bpm.  IMPRESSIONS - Severe obstructive sleep apnea occurred during this study (AHI 41.6/h). There is a significant positional component with supine sleep AHI 49.2/h vs non-supine sleep AHI 3.79. - No significant central sleep apnea occurred during this study (CAI = 0.0/h). - Moderate oxygen desaturation to a nadir of 86%. - Patient snored 35.0% during the sleep.  DIAGNOSIS - Obstructive Sleep Apnea (327.23 [G47.33 ICD-10]) - Nocturnal Hypoxemia (327.26 [G47.36 ICD-10])  RECOMMENDATIONS - In this  patient with significant cardiovascular co-morbidities recommend an in-lab CPAP titration study for optimal evaluation of her severe OSA. - Efforts should be made to optimize nasal and oropharyngeal patency. - Positional therapy avoiding supine position during sleep. - Avoid alcohol, sedatives and other CNS depressants that may worsen sleep apnea and disrupt normal sleep architecture. - Sleep hygiene should be reviewed to assess factors that may improve sleep quality. - Weight management (BMI 36) and regular exercise should be initiated or continued. - Recommend a sleep clinic evaluation after download and 4 weeks of CPAP therapy.  [Electronically signed] 02/26/2018 07:21 PM  Shelva Majestic MD, [facility], [place], American Board of Sleep Medicine   NPI: [id number] [place] PH: [phone]   FX: [phone] [place]

## 2018-03-01 ENCOUNTER — Other Ambulatory Visit: Payer: Self-pay | Admitting: Cardiovascular Disease

## 2018-03-01 ENCOUNTER — Telehealth: Payer: Self-pay | Admitting: *Deleted

## 2018-03-01 DIAGNOSIS — I10 Essential (primary) hypertension: Secondary | ICD-10-CM

## 2018-03-01 DIAGNOSIS — G4733 Obstructive sleep apnea (adult) (pediatric): Secondary | ICD-10-CM

## 2018-03-01 NOTE — Telephone Encounter (Signed)
Patient notified of sleep study results and recommendations. She agrees to proceed with CPAP titration study. 

## 2018-03-01 NOTE — Telephone Encounter (Signed)
-----   Message from Troy Sine, MD sent at 02/26/2018  7:26 PM EDT ----- Mariann Laster, Severe OSA;  Please notify pt and set up for in lab CPAP titration

## 2018-03-02 ENCOUNTER — Telehealth: Payer: Self-pay | Admitting: *Deleted

## 2018-03-02 NOTE — Telephone Encounter (Signed)
Submitted PA request via web porta for CPAP titration. It was denied. They however did approve APAP to be provided by [facility]. I will give to Dr Claiborne Billings to RX auto pressure settings.

## 2018-03-02 NOTE — Telephone Encounter (Signed)
-----   Message from Lauralee Evener, Herman sent at 03/01/2018 10:16 AM EDT ----- CPAP titration.

## 2018-03-15 DIAGNOSIS — G4733 Obstructive sleep apnea (adult) (pediatric): Secondary | ICD-10-CM | POA: Diagnosis not present

## 2018-04-06 ENCOUNTER — Ambulatory Visit (AMBULATORY_SURGERY_CENTER): Payer: BLUE CROSS/BLUE SHIELD | Admitting: Internal Medicine

## 2018-04-06 ENCOUNTER — Encounter: Payer: Self-pay | Admitting: Internal Medicine

## 2018-04-06 VITALS — BP 130/80 | HR 70 | Temp 98.0°F | Resp 17 | Ht 67.0 in | Wt 231.0 lb

## 2018-04-06 DIAGNOSIS — K449 Diaphragmatic hernia without obstruction or gangrene: Secondary | ICD-10-CM | POA: Diagnosis not present

## 2018-04-06 DIAGNOSIS — K635 Polyp of colon: Secondary | ICD-10-CM

## 2018-04-06 DIAGNOSIS — K3189 Other diseases of stomach and duodenum: Secondary | ICD-10-CM

## 2018-04-06 DIAGNOSIS — D124 Benign neoplasm of descending colon: Secondary | ICD-10-CM

## 2018-04-06 DIAGNOSIS — D123 Benign neoplasm of transverse colon: Secondary | ICD-10-CM | POA: Diagnosis not present

## 2018-04-06 DIAGNOSIS — K219 Gastro-esophageal reflux disease without esophagitis: Secondary | ICD-10-CM

## 2018-04-06 DIAGNOSIS — D125 Benign neoplasm of sigmoid colon: Secondary | ICD-10-CM | POA: Diagnosis not present

## 2018-04-06 DIAGNOSIS — D122 Benign neoplasm of ascending colon: Secondary | ICD-10-CM

## 2018-04-06 DIAGNOSIS — Z86010 Personal history of colonic polyps: Secondary | ICD-10-CM

## 2018-04-06 MED ORDER — SODIUM CHLORIDE 0.9 % IV SOLN: 500.0000 mL | Freq: Once | INTRAVENOUS | Status: DC

## 2018-04-06 MED ORDER — PANTOPRAZOLE SODIUM 40 MG PO TBEC: 40.0000 mg | DELAYED_RELEASE_TABLET | Freq: Two times a day (BID) | ORAL | 2 refills | Status: DC

## 2018-04-06 NOTE — Progress Notes (Signed)
Report to PACU, RN, vss, BBS= Clear.  

## 2018-04-06 NOTE — Op Note (Signed)
[place] Patient Name: Nichole Burke Procedure Date: 04/06/2018 8:02 AM MRN: [id number] Endoscopist: Jerene Bears , MD Age: 57 Referring MD:  Date of Birth: Mar 25, 1961 Gender: Female Account #: [id number] Procedure:                Colonoscopy Indications:              High risk colon cancer surveillance: Personal                            history of adenoma (10 mm or greater in size), Last                            colonoscopy: 2014 Medicines:                Monitored Anesthesia Care Procedure:                Pre-Anesthesia Assessment:                           - Prior to the procedure, a History and Physical                            was performed, and patient medications and                            allergies were reviewed. The patient's tolerance of                            previous anesthesia was also reviewed. The risks                            and benefits of the procedure and the sedation                            options and risks were discussed with the patient.                            All questions were answered, and informed consent                            was obtained. Prior Anticoagulants: The patient has                            taken no previous anticoagulant or antiplatelet                            agents. ASA Grade Assessment: II - A patient with                            mild systemic disease. After reviewing the risks                            and benefits, the patient was deemed in  satisfactory condition to undergo the procedure.                           After obtaining informed consent, the colonoscope                            was passed under direct vision. Throughout the                            procedure, the patient's blood pressure, pulse, and                            oxygen saturations were monitored continuously. The                            Colonoscope was introduced through the anus  and                            advanced to the cecum, identified by appendiceal                            orifice and ileocecal valve. The colonoscopy was                            performed without difficulty. The patient tolerated                            the procedure well. The quality of the bowel                            preparation was good. The ileocecal valve,                            appendiceal orifice, and rectum were photographed. Scope In: 8:22:04 AM Scope Out: 8:44:52 AM Scope Withdrawal Time: 0 hours 18 minutes 37 seconds  Total Procedure Duration: 0 hours 22 minutes 48 seconds  Findings:                 The digital rectal exam was normal.                           Two sessile polyps were found in the ascending                            colon. The polyps were 4 to 6 mm in size. These                            polyps were removed with a cold snare. Resection                            and retrieval were complete.                           Four sessile polyps were found in the transverse  colon. The polyps were 3 to 6 mm in size. These                            polyps were removed with a cold snare. Resection                            and retrieval were complete.                           A 4 mm polyp was found in the descending colon. The                            polyp was sessile. The polyp was removed with a                            cold snare. Resection and retrieval were complete.                           Four sessile polyps were found in the sigmoid                            colon. The polyps were 3 to 6 mm in size. These                            polyps were removed with a cold snare. Resection                            and retrieval were complete.                           Multiple small-mouthed diverticula were found in                            the hepatic flexure and ascending colon.                           Internal  hemorrhoids were found during                            retroflexion. The hemorrhoids were small. Complications:            No immediate complications. Estimated Blood Loss:     Estimated blood loss was minimal. Impression:               - Two 4 to 6 mm polyps in the ascending colon,                            removed with a cold snare. Resected and retrieved.                           - Four 3 to 6 mm polyps in the transverse colon,  removed with a cold snare. Resected and retrieved.                           - One 4 mm polyp in the descending colon, removed                            with a cold snare. Resected and retrieved.                           - Four 3 to 6 mm polyps in the sigmoid colon,                            removed with a cold snare. Resected and retrieved.                           - Diverticulosis at the hepatic flexure and in the                            ascending colon.                           - Small internal hemorrhoids. Recommendation:           - Patient has a contact number available for                            emergencies. The signs and symptoms of potential                            delayed complications were discussed with the                            patient. Return to normal activities tomorrow.                            Written discharge instructions were provided to the                            patient.                           - Resume previous diet.                           - Continue present medications.                           - Await pathology results.                           - Repeat colonoscopy is recommended for                            surveillance. The colonoscopy date will be  determined after pathology results from today's                            exam become available for review. Jerene Bears, MD 04/06/2018 8:54:32 AM This report has been signed electronically.

## 2018-04-06 NOTE — Op Note (Signed)
[place] Patient Name: Nichole Burke Procedure Date: 04/06/2018 8:02 AM MRN: [id number] Endoscopist: Jerene Bears , MD Age: 57 Referring MD:  Date of Birth: [birth year] Gender: Female Account #: [id number] Procedure:                Upper GI endoscopy Indications:              Indigestion, Heartburn Medicines:                Monitored Anesthesia Care Procedure:                Pre-Anesthesia Assessment:                           - Prior to the procedure, a History and Physical                            was performed, and patient medications and                            allergies were reviewed. The patient's tolerance of                            previous anesthesia was also reviewed. The risks                            and benefits of the procedure and the sedation                            options and risks were discussed with the patient.                            All questions were answered, and informed consent                            was obtained. Prior Anticoagulants: The patient has                            taken no previous anticoagulant or antiplatelet                            agents. ASA Grade Assessment: II - A patient with                            mild systemic disease. After reviewing the risks                            and benefits, the patient was deemed in                            satisfactory condition to undergo the procedure.                           After obtaining informed consent, the endoscope was  passed under direct vision. Throughout the                            procedure, the patient's blood pressure, pulse, and                            oxygen saturations were monitored continuously. The                            Endoscope was introduced through the mouth, and                            advanced to the third part of duodenum. The upper                            GI endoscopy was accomplished  without difficulty.                            The patient tolerated the procedure well. Scope In: Scope Out: Findings:                 In the proximal esophagus there was a small inlet                            patch without nodularity or visible abnormality.                           A 2-3 cm hiatal hernia was present.                           The exam of the esophagus was otherwise normal.                           The entire examined stomach was normal. Biopsies                            were taken with a cold forceps for histology and                            Helicobacter pylori testing.                           The examined duodenum was normal. Complications:            No immediate complications. Estimated Blood Loss:     Estimated blood loss was minimal. Impression:               - 2-3 cm hiatal hernia.                           - Normal stomach. Biopsied.                           - Normal examined duodenum. Recommendation:           - Patient has a contact number available for  emergencies. The signs and symptoms of potential                            delayed complications were discussed with the                            patient. Return to normal activities tomorrow.                            Written discharge instructions were provided to the                            patient.                           - Resume previous diet.                           - Continue present medications. Given persistent                            nocturnal indigestion/cough on pantoprazole 40 mg                            once daily and famotidine at bedtime; change to                            pantoprazole 40 mg twice daily before 1st and last                            meal of the day.                           - Await pathology results. Jerene Bears, MD 04/06/2018 8:50:53 AM This report has been signed electronically.

## 2018-04-06 NOTE — Patient Instructions (Signed)
Handouts provided:  Polyps and Diverticulosis  START taking Protonix 40 mg Twice daily, before breakfast and dinner.   YOU HAD AN ENDOSCOPIC PROCEDURE TODAY AT [facility]:   Refer to the procedure report that was given to you for any specific questions about what was found during the examination.  If the procedure report does not answer your questions, please call your gastroenterologist to clarify.  If you requested that your care partner not be given the details of your procedure findings, then the procedure report has been included in a sealed envelope for you to review at your convenience later.  YOU SHOULD EXPECT: Some feelings of bloating in the abdomen. Passage of more gas than usual.  Walking can help get rid of the air that was put into your GI tract during the procedure and reduce the bloating. If you had a lower endoscopy (such as a colonoscopy or flexible sigmoidoscopy) you may notice spotting of blood in your stool or on the toilet paper. If you underwent a bowel prep for your procedure, you may not have a normal bowel movement for a few days.  Please Note:  You might notice some irritation and congestion in your nose or some drainage.  This is from the oxygen used during your procedure.  There is no need for concern and it should clear up in a day or so.  SYMPTOMS TO REPORT IMMEDIATELY:   Following lower endoscopy (colonoscopy or flexible sigmoidoscopy):  Excessive amounts of blood in the stool  Significant tenderness or worsening of abdominal pains  Swelling of the abdomen that is new, acute  Fever of 100F or higher   Following upper endoscopy (EGD)  Vomiting of blood or coffee ground material  New chest pain or pain under the shoulder blades  Painful or persistently difficult swallowing  New shortness of breath  Fever of 100F or higher  Black, tarry-looking stools  For urgent or emergent issues, a gastroenterologist can be reached at any hour by calling  [phone].   DIET:  We do recommend a small meal at first, but then you may proceed to your regular diet.  Drink plenty of fluids but you should avoid alcoholic beverages for 24 hours.  ACTIVITY:  You should plan to take it easy for the rest of today and you should NOT DRIVE or use heavy machinery until tomorrow (because of the sedation medicines used during the test).    FOLLOW UP: Our staff will call the number listed on your records the next business day following your procedure to check on you and address any questions or concerns that you may have regarding the information given to you following your procedure. If we do not reach you, we will leave a message.  However, if you are feeling well and you are not experiencing any problems, there is no need to return our call.  We will assume that you have returned to your regular daily activities without incident.  If any biopsies were taken you will be contacted by phone or by letter within the next 1-3 weeks.  Please call us at [phone] if you have not heard about the biopsies in 3 weeks.    SIGNATURES/CONFIDENTIALITY: You and/or your care partner have signed paperwork which will be entered into your electronic medical record.  These signatures attest to the fact that that the information above on your After Visit Summary has been reviewed and is understood.  Full responsibility of the confidentiality of this  discharge information lies with you and/or your care-partner.

## 2018-04-06 NOTE — Progress Notes (Signed)
Not passing flatus prior to discharge, patient's stomach is soft, non-tender and no complaints of cramping.

## 2018-04-06 NOTE — Progress Notes (Signed)
Called to room to assist during endoscopic procedure.  Patient ID and intended procedure confirmed with present staff. Received instructions for my participation in the procedure from the performing physician.  

## 2018-04-07 ENCOUNTER — Telehealth: Payer: Self-pay | Admitting: *Deleted

## 2018-04-07 NOTE — Telephone Encounter (Signed)
  Follow up Call-  Call back number 04/06/2018  Post procedure Call Back phone  # [phone]  Permission to leave phone message Yes  Some recent data might be hidden     Patient questions:  Do you have a fever, pain , or abdominal swelling? No. Pain Score  0 *  Have you tolerated food without any problems? Yes.    Have you been able to return to your normal activities? Yes.    Do you have any questions about your discharge instructions: Diet   No. Medications  No. Follow up visit  No.  Do you have questions or concerns about your Care? No.  Actions: * If pain score is 4 or above: No action needed, pain <4.

## 2018-04-09 ENCOUNTER — Encounter: Payer: Self-pay | Admitting: Internal Medicine

## 2018-04-14 DIAGNOSIS — G4733 Obstructive sleep apnea (adult) (pediatric): Secondary | ICD-10-CM | POA: Diagnosis not present

## 2018-04-22 ENCOUNTER — Encounter: Payer: Self-pay | Admitting: Cardiovascular Disease

## 2018-04-22 ENCOUNTER — Ambulatory Visit: Payer: BLUE CROSS/BLUE SHIELD | Admitting: Cardiovascular Disease

## 2018-04-22 VITALS — BP 135/72 | HR 73 | Ht 67.0 in | Wt 230.8 lb

## 2018-04-22 DIAGNOSIS — G4719 Other hypersomnia: Secondary | ICD-10-CM

## 2018-04-22 DIAGNOSIS — I10 Essential (primary) hypertension: Secondary | ICD-10-CM

## 2018-04-22 DIAGNOSIS — Z720 Tobacco use: Secondary | ICD-10-CM

## 2018-04-22 DIAGNOSIS — G4733 Obstructive sleep apnea (adult) (pediatric): Secondary | ICD-10-CM

## 2018-04-22 DIAGNOSIS — Z6836 Body mass index (BMI) 36.0-36.9, adult: Secondary | ICD-10-CM

## 2018-04-22 NOTE — Patient Instructions (Signed)
Follow-Up: At [facility], you and your health needs are our priority.  As part of our continuing mission to provide you with exceptional heart care, we have created designated Provider Care Teams.  These Care Teams include your primary Cardiologist (physician) and Advanced Practice Providers (APPs -  Physician Assistants and Nurse Practitioners) who all work together to provide you with the care you need, when you need it. . You will need a follow up appointment as needed with Dr.Kelly (Sleep study clinic)

## 2018-04-24 ENCOUNTER — Encounter: Payer: Self-pay | Admitting: Cardiovascular Disease

## 2018-04-24 NOTE — Progress Notes (Signed)
Cardiology Office Note    Date:  04/24/2018   ID:  Nichole Burke, DOB 1961/02/15, MRN [id number]  PCP:  Tonia Ghent, MD  Cardiologist:  Shelva Majestic, MD (sleep); Dr. Skeet Latch  New sleep evaluation.  History of Present Illness:  Nichole Burke is a 57 y.o. female who presents for her initial evaluation of recently diagnosed sleep apnea and presents following initiation of CPAP therapy.  Nichole Burke has a history of hypertension, renal cell carcinoma and is status post left nephrectomy and has a history of severe left ventricular hypertrophy.  She has a tobacco history as well as family history for premature coronary artery disease.  When she was seen by Dr. Oval Linsey, she reported a history of loud snoring.  Her husband would often note that she would stop breathing during sleep and he has witnessed apneic spells.  Her sleep is nonrestorative and she had fatigability with excessive daytime sleepiness.  She subsequently was referred for a sleep study.  Insurance denied an in lab study and consequently she underwent a home study which was done on March 14, 2018.  She had significant daytime sleepiness and her Epworth sleepiness scale score endorsed at 18 .  She was found to have overall severe sleep apnea with an AHI of 41.6/h.  Since this was a home study the severity of sleep apnea during rem sleep could not be assessed.  She developed oxygen desaturation to a nadir of 86%.  She was recommended to undergo an in lab CPAP titration but again insurance denied this.  As result she has been on AutoPap therapy with a set up date of March 15, 2018 and has been on a minimum pressure at 6 up to a maximum pressure of 20 centimeters of water.  A download was obtained in the office today from March 21, 2018 through April 19, 2018.  She is meeting medical compliance with usage days at 80%.  She was averaging 6 hours and 26 minutes of CPAP use per night and her usage greater than 4  hours was 73%.  AHI was 4.9.  She is requiring significant pressure for treatment in her 95th percentile pressure is 19.1 with a maximum average pressure at 19.8.  She has a ResMed AirFit F 30 small face mask.  Most nights there is no leak but there were occasional nights with increased leak.  Typically she goes to bed at 8 PM and wakes up at 3:30 AM.  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  She notes improvement in her sleep pattern since initiating CPAP therapy.  She is unaware of breakthrough snoring.  She denies any sleep paralysis, her nocturia has improved nocturnally.  She presents for initial sleep evaluation.  Past Medical History:  Diagnosis Date  . Anxiety   . Elevated LFTs   . GERD (gastroesophageal reflux disease)   . History of hiatal hernia   . HTN (hypertension)   . Plantar fasciitis    Right  . Pure hypercholesterolemia   . Sleep apnea    in home sleep study; OSA; pt does not have CPAP  . Tobacco abuse   . Tremor   . Tubular adenoma of colon   . Wears glasses     Past Surgical History:  Procedure Laterality Date  . LIPOMA EXCISION N/A 10/31/2015   Procedure: OPEN EXCISION LIPOMA;  Surgeon: Alexis Frock, MD;  Location: [facility];  Service: Urology;  Laterality: N/A;  . PELVIC LYMPH NODE DISSECTION Bilateral 10/31/2015   Procedure: PELVIC LYMPH NODE DISSECTION;  Surgeon: Alexis Frock, MD;  Location: [facility];  Service: Urology;  Laterality: Bilateral;  . ROBOT ASSISTED LAPAROSCOPIC NEPHRECTOMY Left 10/31/2015   Procedure: XI ROBOTIC ASSISTED LAPAROSCOPIC NEPHRECTOMY;  Surgeon: Alexis Frock, MD;  Location: [facility];  Service: Urology;  Laterality: Left;  . TUBAL LIGATION  1995   after pregnancy with blighted ovum    Current Medications: Outpatient Medications Prior to Visit  Medication Sig Dispense Refill  . acetaminophen (TYLENOL) 325 MG tablet Take 650 mg by mouth every 6 (six) hours as needed.    Marland Kitchen aspirin EC 81 MG tablet  Take 81 mg by mouth daily.    Marland Kitchen lisinopril (PRINIVIL,ZESTRIL) 20 MG tablet Take 1 tablet (20 mg total) by mouth daily. 90 tablet 3  . metoprolol succinate (TOPROL-XL) 25 MG 24 hr tablet Take 1 tablet (25 mg total) by mouth daily. 90 tablet 3  . Multiple Vitamin (MULTIVITAMIN) tablet Take 1 tablet by mouth daily.    . pantoprazole (PROTONIX) 40 MG tablet Take 1 tablet (40 mg total) by mouth 2 (two) times daily before a meal. 180 tablet 2  . sertraline (ZOLOFT) 100 MG tablet Take 0.5 tablets (50 mg total) by mouth daily. 90 tablet 1   No facility-administered medications prior to visit.      Allergies:   Aleve [naproxen sodium]; Ibuprofen; and Nsaids   Social History   Socioeconomic History  . Marital status: Married    Spouse name: Not on file  . Number of children: 0  . Years of education: Not on file  . Highest education level: Not on file  Occupational History  . 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 Financial resource strain: Not on file  . Food insecurity:    Worry: Not on file    Inability: Not on file  . Transportation needs:    Medical: Not on file    Non-medical: Not on file  Tobacco Use  . Smoking status: Current Every Day Smoker    Packs/day: 1.00    Years: 30.00    Pack years: 30.00    Types: Cigarettes  . Smokeless tobacco: Never Used  Substance and Sexual Activity  . Alcohol use: Yes    Alcohol/week: 3.0 standard drinks    Types: 3 Standard drinks or equivalent per week    Comment: 5th per week.    . Drug use: No  . Sexual activity: Not on file  Lifestyle  . Physical activity:    Days per week: Not on file    Minutes per session: Not on file  . Stress: Not on file  Relationships  . Social connections:    Talks on phone: Not on file    Gets together: Not on file    Attends religious service: Not on file    Active member of club or organization: Not on file    Attends meetings of clubs or organizations: Not on file    Relationship status:  Not on file  Other Topics Concern  . Not on file  Social History Narrative   Married since 1987; lives with husband   No children   Waitress at [employer]   Only exercise is at work     Family History:  The patient's family history includes Coronary artery disease in her father and mother; Diabetes in her brother and mother; Heart attack in her mother; Heart attack (age  of onset: 52) in her father; Heart disease in her father and mother; Kidney cancer in her brother.   Her father died with an MI.  Her mother died and had diabetes mellitus.  She has 3 brothers.  One sister died with kidney cancer.  ROS General: Negative; No fevers, chills, or night sweats;  HEENT: Negative; No changes in vision or hearing, sinus congestion, difficulty swallowing Pulmonary: Negative; No cough, wheezing, shortness of breath, hemoptysis Cardiovascular: Negative; No chest pain, presyncope, syncope, palpitations GI: Negative; No nausea, vomiting, diarrhea, or abdominal pain GU: Negative; No dysuria, hematuria, or difficulty voiding Musculoskeletal: Negative; no myalgias, joint pain, or weakness Hematologic/Oncology: Negative; no easy bruising, bleeding Endocrine: Negative; no heat/cold intolerance; no diabetes Neuro: Negative; no changes in balance, headaches Skin: Negative; No rashes or skin lesions Psychiatric: Negative; No behavioral problems, depression Sleep: See HPI, severe sleep apnea detected on sleep study with CPAP set up date March 15, 2018 with [facility] as her DME company.   Other comprehensive 14 point system review is negative.   PHYSICAL EXAM:   VS:  BP 135/72   Pulse 73   Ht _0  (1.702 m)   Wt 230 lb 12.8 oz (104.7 kg)   LMP 01/19/2013   BMI 36.15 kg/m    Wt Readings from Last 3 Encounters:  04/22/18 230 lb 12.8 oz (104.7 kg)  04/06/18 231 lb (104.8 kg)  02/23/18 231 lb (104.8 kg)    General: Alert, oriented, no distress.  Skin: normal turgor, no rashes, warm  and dry HEENT: Normocephalic, atraumatic. Pupils equal round and reactive to light; sclera anicteric; extraocular muscles intact; Fundi no hemorrhages or exudates Nose without nasal septal hypertrophy Mouth/Parynx benign; Mallinpatti scale 4 Neck: Thick neck no JVD, no carotid bruits; normal carotid upstroke Lungs: clear to ausculatation and percussion; no wheezing or rales Chest wall: without tenderness to palpitation Heart: PMI not displaced, RRR, s1 s2 normal, 1/6 systolic murmur, no diastolic murmur, no rubs, gallops, thrills, or heaves Abdomen: soft, nontender; no hepatosplenomehaly, BS+; abdominal aorta nontender and not dilated by palpation. Back: no CVA tenderness; bilateral back lipomas right greater than left Pulses 2+ Musculoskeletal: full range of motion, normal strength, no joint deformities Extremities: no clubbing cyanosis or edema, Homan's sign negative  Neurologic: grossly nonfocal; Cranial nerves grossly wnl Psychologic: Normal mood and affect   Studies/Labs Reviewed:   EKG:  EKG is not ordered today.  I have personally reviewed the ECG from January 15, 2018 which shows normal sinus rhythm at 73 bpm without ectopy and with normal intervals.  Recent Labs: BMP Latest Ref Rng & Units 02/18/2018 02/08/2018 11/23/2017  Glucose 70 - 99 mg/dL 106(H) 144(H) 111(H)  BUN 6 - 23 mg/dL _1 Creatinine 0.40 - 1.20 mg/dL 1.35(H) 1.62(H) 1.33(H)  Sodium 135 - 145 mEq/L 140 141 142  Potassium 3.5 - 5.1 mEq/L 4.5 4.3 4.8  Chloride 96 - 112 mEq/L 104 105 104  CO2 19 - 32 mEq/L _2 Calcium 8.4 - 10.5 mg/dL 9.8 9.0 9.2     Hepatic Function Latest Ref Rng & Units 02/08/2018 11/23/2017 11/17/2016  Total Protein 6.0 - 8.3 g/dL 6.3 6.8 6.6  Albumin 3.5 - 5.2 g/dL 3.9 4.0 4.1  AST 0 - 37 U/L 32 59(H) 33  ALT 0 - 35 U/L 36(H) 52(H) 32  Alk Phosphatase 39 - 117 U/L 93 87 74  Total Bilirubin 0.2 - 1.2 mg/dL 0.3 0.3 0.4  Bilirubin, Direct 0.0 -  0.3 mg/dL - - -    CBC Latest  Ref Rng & Units 11/01/2015 10/31/2015 10/24/2015  WBC 4.0 - 10.5 K/uL - - 10.7(H)  Hemoglobin 12.0 - 15.0 g/dL 13.1 14.4 14.2  Hematocrit 36.0 - 46.0 % 40.9 42.4 43.9  Platelets 150 - 400 K/uL - - 156   Lab Results  Component Value Date   MCV 92.6 10/24/2015   MCV 90.5 05/17/2014   MCV 86.2 11/14/2008   Lab Results  Component Value Date   TSH 1.33 05/17/2014   Lab Results  Component Value Date   HGBA1C 6.4 11/23/2017     BNP No results found for: BNP  ProBNP No results found for: PROBNP   Lipid Panel     Component Value Date/Time   CHOL 185 11/23/2017 0805   TRIG 167.0 (H) 11/23/2017 0805   HDL 36.30 (L) 11/23/2017 0805   CHOLHDL 5 11/23/2017 0805   VLDL 33.4 11/23/2017 0805   LDLCALC 115 (H) 11/23/2017 0805     RADIOLOGY: No results found.   Additional studies/ records that were reviewed today include:  I reviewed the records of Dr. Oval Linsey.  I reviewed her's home sleep study.  A download was obtained in the office today from November 17 through April 19, 2018.   ASSESSMENT:    1. OSA (obstructive sleep apnea)   2. Essential hypertension, benign   3. Class 2 severe obesity due to excess calories with serious comorbidity and body mass index (BMI) of 36.0 to 36.9 in adult ([place])   4. Tobacco abuse   5. Excessive daytime sleepiness      PLAN:  Nichole Burke is a 57 year old female who has a history of moderate obesity, hypertension, strong family history for premature coronary artery disease, and has a history of renal cell carcinoma status post left nephrectomy.  She has been documented to have severe LVH.  He also has a history of tobacco abuse.  She had symptoms very worrisome for sleep apnea including loud snoring, witnessed apnea, nocturia, and significant excessive daytime sleepiness with an Epworth Sleepiness Scale score of 18.  Her home sleep study confirms severe obstructive sleep apnea with an overall AHI at 41.6/h.  I suspect her sleep apnea  is even more severe during rem sleep but this could not be evaluated on a home study.  Her oxygen nadir was 86%.  Her insurance had denied an in lab study so she has been on AutoPap therapy.  I reviewed her most recent download.  She is meeting medical compliance standards.  However, I have recommended improved sleep duration.  Currently she is using CPAP 6 hours and 26 minutes per night.  I have recommended sleep duration of at least 7 and preferably 8 hours per night.  She is requiring very high CPAP pressures with a 95th percentile pressure at 19.1.  As result, I am making adjustments to her current settings.  I will start her ramp at 8 instead of at 4.  I will also start her minimum CPAP pressure at 12 and she will continue to titrate to 20.  If she does require higher pressures BiPAP therapy will be necessary.  I had a lengthy discussion with her in the office today and discussed the adverse consequences with reference to cardiovascular health with severe sleep apnea.  On exam today her blood pressure is stable on repeat by me at 128/70.  She is on Toprol-XL 25 mg daily in addition to lisinopril 20 mg for hypertension  control.  She is on sertraline for anxiety.  I discussed risk of nocturnal arrhythmias, atrial fibrillation, nocturnal ischemia risk with untreated sleep apnea.  She feels improved and is unaware of any breakthrough snoring.  Loud snoring was detected on her home study.  I answered all her questions.  We discussed maintenance hygiene.  We discussed optimal sleep hygiene.  Weight loss and exercise was recommended with minimum exercise of 5 days/week of moderate intensity for 30 minutes if possible.  We discussed the importance of smoking cessation.Marland Kitchen  She will return to the cardiology care of Dr. Oval Linsey.  I will be available as needed in the future if problems arise from a sleep perspective.   Medication Adjustments/Labs and Tests Ordered: Current medicines are reviewed at length with the  patient today.  Concerns regarding medicines are outlined above.  Medication changes, Labs and Tests ordered today are listed in the Patient Instructions below. Patient Instructions  Follow-Up: At [facility], you and your health needs are our priority.  As part of our continuing mission to provide you with exceptional heart care, we have created designated Provider Care Teams.  These Care Teams include your primary Cardiologist (physician) and Advanced Practice Providers (APPs -  Physician Assistants and Nurse Practitioners) who all work together to provide you with the care you need, when you need it. Marland Kitchen You will need a follow up appointment as needed with Dr.Kaleo Condrey (Sleep study clinic)       Signed, Shelva Majestic, MD  04/24/2018 2:31 PM    [place] [address] [place], [place], [place]  [address] Phone: [phone]

## 2018-05-03 DIAGNOSIS — G4733 Obstructive sleep apnea (adult) (pediatric): Secondary | ICD-10-CM | POA: Diagnosis not present

## 2018-05-15 DIAGNOSIS — G4733 Obstructive sleep apnea (adult) (pediatric): Secondary | ICD-10-CM | POA: Diagnosis not present

## 2018-05-24 IMAGING — CR DG CHEST 2V
2 series · 2 of 2 positions shown · non-contrast
Comparison: 09/27/2015

CLINICAL DATA: Malignant neoplasm of left kidney

EXAM:
CHEST  2 VIEW

[w chest pa]
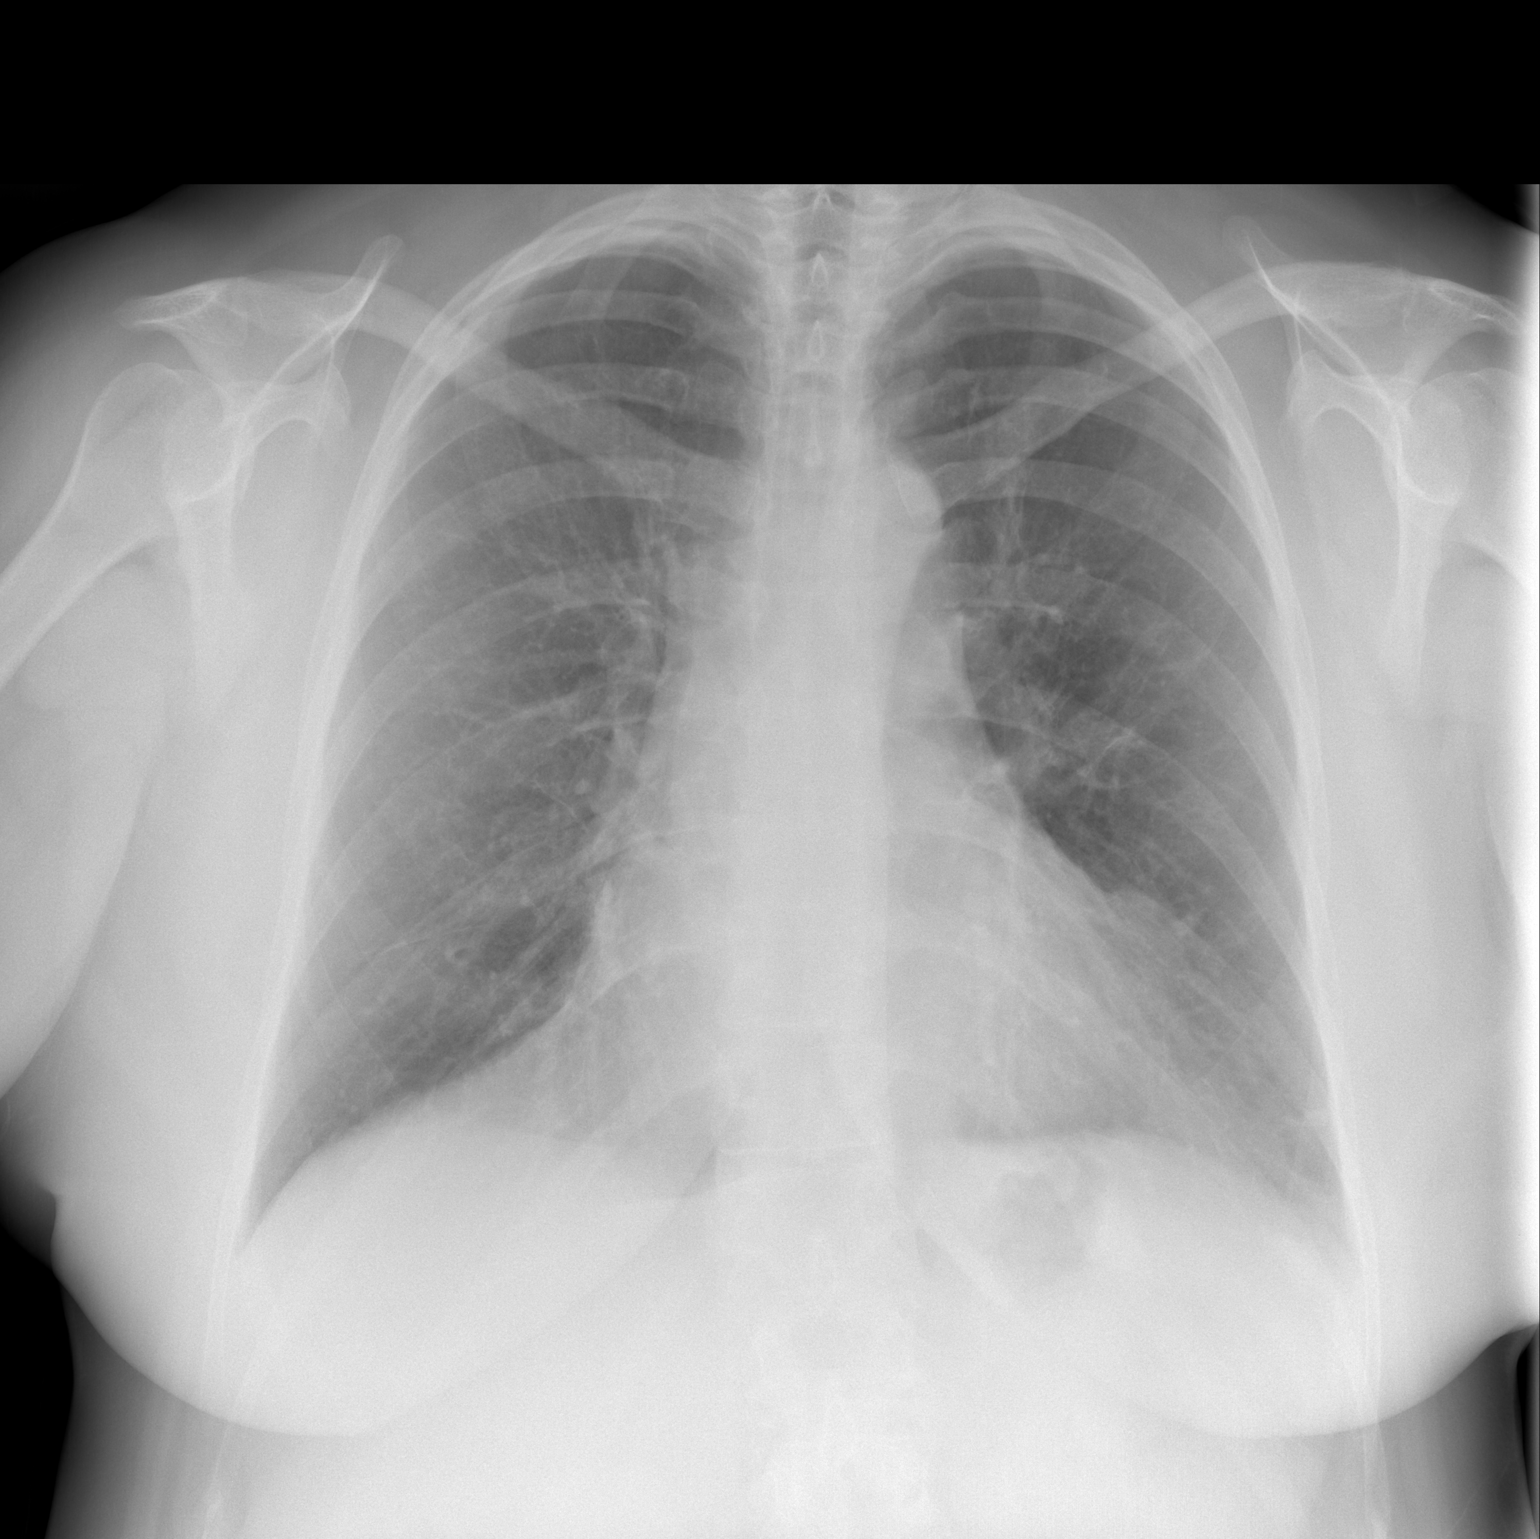

[w chest lat]
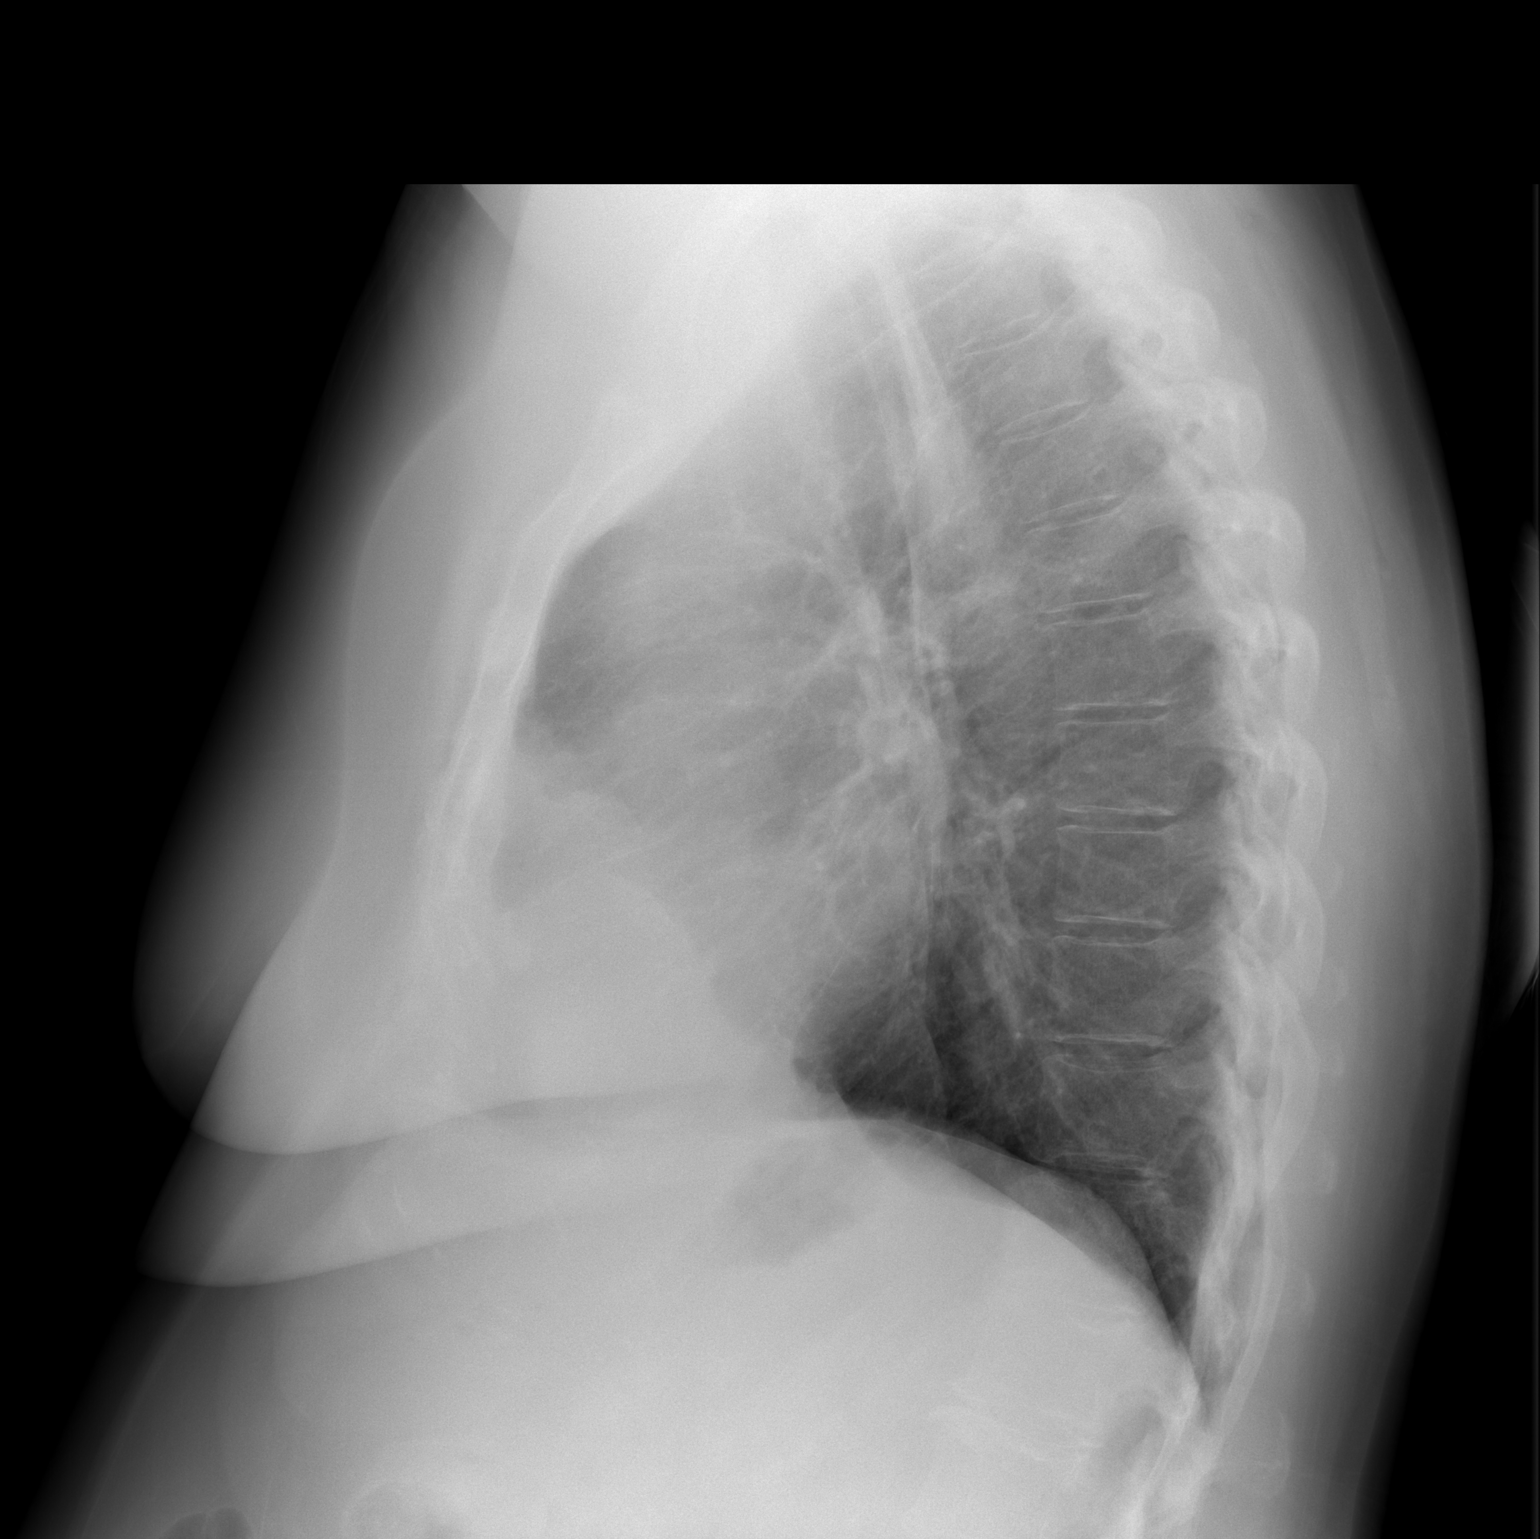

[2 of 2 positions shown; findings below may reference images not displayed]

FINDINGS: Normal heart size. Bulge along the left heart border is stable and
correlates with fat pad on MRA. No indication of adenopathy. Lungs
remain clear with no evidence of nodule or consolidation. No
aggressive osseous findings.
IMPRESSION: Stable appearance of the chest.  No evidence of nodule.

## 2018-05-27 ENCOUNTER — Telehealth: Payer: Self-pay | Admitting: Family Medicine

## 2018-05-27 DIAGNOSIS — R7989 Other specified abnormal findings of blood chemistry: Secondary | ICD-10-CM

## 2018-05-27 DIAGNOSIS — R739 Hyperglycemia, unspecified: Secondary | ICD-10-CM

## 2018-05-27 DIAGNOSIS — R945 Abnormal results of liver function studies: Secondary | ICD-10-CM

## 2018-05-27 NOTE — Telephone Encounter (Signed)
Patient advised.  Lab appt scheduled.  

## 2018-05-27 NOTE — Telephone Encounter (Signed)
Due for recheck LFTs and sugar.  I put in the orders.  nonfasting lab visit.  Thanks.

## 2018-05-31 ENCOUNTER — Other Ambulatory Visit (INDEPENDENT_AMBULATORY_CARE_PROVIDER_SITE_OTHER): Payer: BLUE CROSS/BLUE SHIELD

## 2018-05-31 DIAGNOSIS — R739 Hyperglycemia, unspecified: Secondary | ICD-10-CM

## 2018-05-31 DIAGNOSIS — R7989 Other specified abnormal findings of blood chemistry: Secondary | ICD-10-CM

## 2018-05-31 DIAGNOSIS — R945 Abnormal results of liver function studies: Secondary | ICD-10-CM

## 2018-05-31 LAB — COMPREHENSIVE METABOLIC PANEL
ALT: 42 U/L — ABNORMAL HIGH (ref 0–35)
AST: 36 U/L (ref 0–37)
Albumin: 4.1 g/dL (ref 3.5–5.2)
Alkaline Phosphatase: 100 U/L (ref 39–117)
BUN: 21 mg/dL (ref 6–23)
CO2: 27 mEq/L (ref 19–32)
CREATININE: 1.53 mg/dL — AB (ref 0.40–1.20)
Calcium: 9.7 mg/dL (ref 8.4–10.5)
Chloride: 103 mEq/L (ref 96–112)
GFR: 35.14 mL/min — ABNORMAL LOW (ref >60.00)
Glucose, Bld: 116 mg/dL — ABNORMAL HIGH (ref 70–99)
Potassium: 4.9 mEq/L (ref 3.5–5.1)
Sodium: 138 mEq/L (ref 135–145)
Total Bilirubin: 0.4 mg/dL (ref 0.2–1.2)
Total Protein: 6.5 g/dL (ref 6.0–8.3)

## 2018-05-31 LAB — HEMOGLOBIN A1C: Hgb A1c MFr Bld: 5.9 % (ref 4.6–6.5)

## 2018-06-15 DIAGNOSIS — G4733 Obstructive sleep apnea (adult) (pediatric): Secondary | ICD-10-CM | POA: Diagnosis not present

## 2018-07-14 DIAGNOSIS — G4733 Obstructive sleep apnea (adult) (pediatric): Secondary | ICD-10-CM | POA: Diagnosis not present

## 2018-07-15 ENCOUNTER — Telehealth: Payer: Self-pay | Admitting: *Deleted

## 2018-07-15 NOTE — Telephone Encounter (Signed)
Patient called in to report that she doesn't feel that she can continue using her CPAP machine. States that she is getting recurrent sinus infections, which she feels is related to her CPAP therapy. Patient does admit to cleaning her mask daily. I asked about the humidity level. She says that it is set on "Auto". I  will advise both Dr Claiborne Billings and Anderson Malta @ [facility] and call her back with recommendations. Patient agrees with this approach and will await a return message.

## 2018-07-16 ENCOUNTER — Telehealth: Payer: Self-pay | Admitting: *Deleted

## 2018-07-16 NOTE — Telephone Encounter (Signed)
Returned a call to the patient informing her after speaking with both Dr Claiborne Billings and Anderson Malta with [facility] they don't feel that her recurrent sinus infections are related to her CPAP therapy. Dr Claiborne Billings recommends that she continue to use the machine, making sure to keep it cleaned.speak with her PCP or maybe see ENT for her recurrent symptoms. He nor Anderson Malta feel that her symptoms are coming from the CPAP machine and recommend continued use. Patient voiced understanding stating that although she cleans the mask daily she has not been cleaning the hose. She will start cleaning the hose daily as well to see if this will make a difference. If not she will consult with her PCP.

## 2018-10-31 ENCOUNTER — Other Ambulatory Visit: Payer: Self-pay | Admitting: Family Medicine

## 2019-01-23 ENCOUNTER — Other Ambulatory Visit: Payer: Self-pay | Admitting: Family Medicine

## 2019-01-24 ENCOUNTER — Encounter: Payer: Self-pay | Admitting: Family Medicine

## 2019-04-21 ENCOUNTER — Encounter: Payer: Self-pay | Admitting: Internal Medicine

## 2019-05-02 ENCOUNTER — Other Ambulatory Visit: Payer: Self-pay | Admitting: Family Medicine

## 2019-05-16 ENCOUNTER — Other Ambulatory Visit: Payer: Self-pay | Admitting: Family Medicine

## 2019-05-16 ENCOUNTER — Other Ambulatory Visit: Payer: Self-pay

## 2019-05-16 ENCOUNTER — Other Ambulatory Visit (INDEPENDENT_AMBULATORY_CARE_PROVIDER_SITE_OTHER): Payer: Self-pay

## 2019-05-16 DIAGNOSIS — I10 Essential (primary) hypertension: Secondary | ICD-10-CM

## 2019-05-16 DIAGNOSIS — R739 Hyperglycemia, unspecified: Secondary | ICD-10-CM

## 2019-05-16 LAB — LIPID PANEL
Cholesterol: 183 mg/dL (ref 0–200)
HDL: 40 mg/dL (ref >39.00)
LDL Cholesterol: 114 mg/dL — ABNORMAL HIGH (ref 0–99)
NonHDL: 143.46
Total CHOL/HDL Ratio: 5
Triglycerides: 147 mg/dL (ref 0.0–149.0)
VLDL: 29.4 mg/dL (ref 0.0–40.0)

## 2019-05-16 LAB — COMPREHENSIVE METABOLIC PANEL
ALT: 26 U/L (ref 0–35)
AST: 22 U/L (ref 0–37)
Albumin: 4 g/dL (ref 3.5–5.2)
Alkaline Phosphatase: 86 U/L (ref 39–117)
BUN: 13 mg/dL (ref 6–23)
CO2: 30 mEq/L (ref 19–32)
Calcium: 9.4 mg/dL (ref 8.4–10.5)
Chloride: 105 mEq/L (ref 96–112)
Creatinine, Ser: 1.13 mg/dL (ref 0.40–1.20)
GFR: 49.68 mL/min — ABNORMAL LOW (ref >60.00)
Glucose, Bld: 106 mg/dL — ABNORMAL HIGH (ref 70–99)
Potassium: 4.5 mEq/L (ref 3.5–5.1)
Sodium: 141 mEq/L (ref 135–145)
Total Bilirubin: 0.5 mg/dL (ref 0.2–1.2)
Total Protein: 6.6 g/dL (ref 6.0–8.3)

## 2019-05-16 LAB — HEMOGLOBIN A1C: Hgb A1c MFr Bld: 5.8 % (ref 4.6–6.5)

## 2019-05-19 ENCOUNTER — Ambulatory Visit (INDEPENDENT_AMBULATORY_CARE_PROVIDER_SITE_OTHER): Payer: Self-pay | Admitting: Family Medicine

## 2019-05-19 ENCOUNTER — Encounter: Payer: Self-pay | Admitting: Family Medicine

## 2019-05-19 ENCOUNTER — Other Ambulatory Visit: Payer: Self-pay

## 2019-05-19 ENCOUNTER — Ambulatory Visit (INDEPENDENT_AMBULATORY_CARE_PROVIDER_SITE_OTHER)
Admission: RE | Admit: 2019-05-19 | Discharge: 2019-05-19 | Disposition: A | Discharge status: Home / Self Care | Payer: Self-pay | Source: Ambulatory Visit | Attending: Family Medicine | Admitting: Family Medicine

## 2019-05-19 VITALS — BP 130/70 | HR 70 | Temp 96.9°F | Ht 67.0 in | Wt 224.6 lb

## 2019-05-19 DIAGNOSIS — G8929 Other chronic pain: Secondary | ICD-10-CM

## 2019-05-19 DIAGNOSIS — R251 Tremor, unspecified: Secondary | ICD-10-CM

## 2019-05-19 DIAGNOSIS — R202 Paresthesia of skin: Secondary | ICD-10-CM

## 2019-05-19 DIAGNOSIS — M545 Low back pain, unspecified: Secondary | ICD-10-CM

## 2019-05-19 DIAGNOSIS — Z23 Encounter for immunization: Secondary | ICD-10-CM

## 2019-05-19 DIAGNOSIS — Z789 Other specified health status: Secondary | ICD-10-CM

## 2019-05-19 DIAGNOSIS — F17200 Nicotine dependence, unspecified, uncomplicated: Secondary | ICD-10-CM

## 2019-05-19 DIAGNOSIS — Z905 Acquired absence of kidney: Secondary | ICD-10-CM

## 2019-05-19 DIAGNOSIS — Z7289 Other problems related to lifestyle: Secondary | ICD-10-CM

## 2019-05-19 DIAGNOSIS — Z7189 Other specified counseling: Secondary | ICD-10-CM

## 2019-05-19 DIAGNOSIS — Z0000 Encounter for general adult medical examination without abnormal findings: Secondary | ICD-10-CM

## 2019-05-19 LAB — VITAMIN B12: Vitamin B-12: 346 pg/mL (ref 211–911)

## 2019-05-19 LAB — TSH: TSH: 1.12 u[IU]/mL (ref 0.35–4.50)

## 2019-05-19 MED ORDER — SERTRALINE HCL 100 MG PO TABS: 50.0000 mg | ORAL_TABLET | Freq: Every day | ORAL | 1 refills | Status: DC

## 2019-05-19 MED ORDER — PANTOPRAZOLE SODIUM 40 MG PO TBEC: 40.0000 mg | DELAYED_RELEASE_TABLET | Freq: Every day | ORAL | 3 refills | Status: DC

## 2019-05-19 MED ORDER — METOPROLOL SUCCINATE ER 25 MG PO TB24: 25.0000 mg | ORAL_TABLET | Freq: Every day | ORAL | 3 refills | Status: DC

## 2019-05-19 MED ORDER — LISINOPRIL 20 MG PO TABS: 20.0000 mg | ORAL_TABLET | Freq: Every day | ORAL | 3 refills | Status: DC

## 2019-05-19 NOTE — Progress Notes (Signed)
This visit occurred during the SARS-CoV-2 public health emergency.  Safety protocols were in place, including screening questions prior to the visit, additional usage of staff PPE, and extensive cleaning of exam room while observing appropriate contact time as indicated for disinfecting solutions.  CPE- See plan.  Routine anticipatory guidance given to patient.  See health maintenance.  The possibility exists that previously documented standard health maintenance information may have been brought forward from a previous encounter into this note.  If needed, that same information has been updated to reflect the current situation based on today's encounter.    Tetanus 2013 Flu shot today  PNA due at 67.  Shingles d/w pt.   Tobacco and etoh discussed, precontemplative on both.   Living will, encouraged. Husband designated if incapacitated.  Mammogram due.  D/w pt.  See avs.   DXA not due.  Pap 2014- declined 2020. No discharge, no bleeding. No hot flashes but some occ sweats attributed to menopause.   Colonoscopy 2019, due, d/w pt.    Diet and exercise- d/w pt. Encouraged both, esp exercise as tolerated with her back pain.   [facility] report, so HIV and HCV screening would have been done at that point.  covid vaccine d/w pt.  See avs.   Back pain.  Lower midline back pain.  Worse some days than others.  Worse after prolonged standing.  Sitting helps with the pain.  No pain laying down, feels better laying down.  No B radicular pain.    Tremor at baseline.  Still on beta blocker.  Not bad enough to treat differently at this point per patient report.  R>L hand, head tremor noted.    Hypertension:    Using medication without problems or lightheadedness: yes Chest pain with exertion:no Edema:no Short of breath:no  S/p nephrectomy and nsaid caution d/w pt.  No blood in urine.  I asked her about seeing urology once more.    Burning and  tingling/stinging in B feet, going on for months.  No hand sx.    See after visit summary.  PMH and SH reviewed  Meds, vitals, and allergies reviewed.   ROS: Per HPI.  Unless specifically indicated otherwise in HPI, the patient denies:  General: fever. Eyes: acute vision changes ENT: sore throat Cardiovascular: chest pain Respiratory: SOB GI: vomiting GU: dysuria Musculoskeletal: acute back pain Derm: acute rash Neuro: acute motor dysfunction Psych: worsening mood Endocrine: polydipsia Heme: bleeding Allergy: hayfever  GEN: nad, alert and oriented HEENT: ncat NECK: supple w/o LA CV: rrr. PULM: ctab, no inc wob ABD: soft, +bs EXT: no edema SKIN: no acute rash Tremor at baseline. Lower midline back tender to palpation without rash.  Normal inspection No skin breakdown No calluses  Normal DP pulses Dec sensation to light touch but normal to vibration Nails normal

## 2019-05-19 NOTE — Patient Instructions (Addendum)
Go to the lab on the way out.  We'll contact you with your lab and xray report.  Check with your insurance to see if they will cover the shingles shot.  [email or web address]  You can call for a mammogram at [place] [phone]  Call GI and urology about follow up.  Let me know if you have trouble getting an appointment.    Take care.  Glad to see you.

## 2019-05-22 DIAGNOSIS — R202 Paresthesia of skin: Secondary | ICD-10-CM | POA: Insufficient documentation

## 2019-05-22 NOTE — Assessment & Plan Note (Addendum)
Tetanus 2013 Flu shot today  PNA due at 67.  Shingles d/w pt.   Tobacco and etoh discussed, precontemplative on both.   Living will, encouraged. Husband designated if incapacitated.  Mammogram due.  D/w pt.  See avs.   DXA not due.  Pap 2014- declined 2020. No discharge, no bleeding. No hot flashes but some occ sweats attributed to menopause.   Colonoscopy 2019, due, d/w pt.    Diet and exercise- d/w pt. Encouraged both, esp exercise as tolerated with her back pain.   [facility] report, so HIV and HCV screening would have been done at that point.  covid vaccine d/w pt.  See avs.

## 2019-05-22 NOTE — Assessment & Plan Note (Signed)
She has pain is worse after prolonged standing and better when she sits down.  She does not have any weakness or bilateral radicular leg pain.  Discussed options.  Reasonable to repeat plain films today.  See notes on imaging.

## 2019-05-22 NOTE — Assessment & Plan Note (Signed)
Creatinine improved.  Discussed with patient.  NSAID caution discussed with patient.

## 2019-05-22 NOTE — Assessment & Plan Note (Signed)
Encouraged taper.

## 2019-05-22 NOTE — Assessment & Plan Note (Signed)
See notes on labs.  Only noted on the feet with dec sensation to light touch but normal to vibration

## 2019-05-22 NOTE — Assessment & Plan Note (Signed)
Living will, encouraged. Husband designated if incapacitated.

## 2019-05-22 NOTE — Assessment & Plan Note (Signed)
Continue beta-blocker as is for now.  She will update me as needed.

## 2019-05-22 NOTE — Assessment & Plan Note (Signed)
Encourage cessation. °

## 2019-05-23 ENCOUNTER — Other Ambulatory Visit: Payer: Self-pay | Admitting: Family Medicine

## 2019-05-23 DIAGNOSIS — M545 Low back pain, unspecified: Secondary | ICD-10-CM

## 2020-05-23 ENCOUNTER — Other Ambulatory Visit: Payer: Self-pay | Admitting: Family Medicine

## 2020-05-23 NOTE — Telephone Encounter (Signed)
Pharmacy requests refill on: Sertraline 100 mg   LAST REFILL: 05/19/2019 (Q-90, R-2) LAST OV: 05/19/2019 NEXT OV: Not Scheduled  PHARMACY: [id number] [place], [place]

## 2020-05-23 NOTE — Telephone Encounter (Signed)
Sent.  Please schedule yearly visit when possible.  Thanks.

## 2020-05-27 ENCOUNTER — Other Ambulatory Visit: Payer: Self-pay | Admitting: Family Medicine

## 2020-05-27 DIAGNOSIS — I10 Essential (primary) hypertension: Secondary | ICD-10-CM

## 2020-05-27 DIAGNOSIS — R739 Hyperglycemia, unspecified: Secondary | ICD-10-CM

## 2020-05-30 ENCOUNTER — Other Ambulatory Visit (INDEPENDENT_AMBULATORY_CARE_PROVIDER_SITE_OTHER): Payer: 59

## 2020-05-30 ENCOUNTER — Other Ambulatory Visit: Payer: Self-pay

## 2020-05-30 DIAGNOSIS — I10 Essential (primary) hypertension: Secondary | ICD-10-CM | POA: Diagnosis not present

## 2020-05-30 DIAGNOSIS — R739 Hyperglycemia, unspecified: Secondary | ICD-10-CM

## 2020-05-30 LAB — COMPREHENSIVE METABOLIC PANEL
ALT: 27 U/L (ref 0–35)
AST: 29 U/L (ref 0–37)
Albumin: 4.2 g/dL (ref 3.5–5.2)
Alkaline Phosphatase: 84 U/L (ref 39–117)
BUN: 19 mg/dL (ref 6–23)
CO2: 30 mEq/L (ref 19–32)
Calcium: 9.8 mg/dL (ref 8.4–10.5)
Chloride: 105 mEq/L (ref 96–112)
Creatinine, Ser: 1.14 mg/dL (ref 0.40–1.20)
GFR: 53.35 mL/min — ABNORMAL LOW (ref >60.00)
Glucose, Bld: 112 mg/dL — ABNORMAL HIGH (ref 70–99)
Potassium: 4.7 mEq/L (ref 3.5–5.1)
Sodium: 142 mEq/L (ref 135–145)
Total Bilirubin: 0.6 mg/dL (ref 0.2–1.2)
Total Protein: 6.8 g/dL (ref 6.0–8.3)

## 2020-05-30 LAB — LIPID PANEL
Cholesterol: 205 mg/dL — ABNORMAL HIGH (ref 0–200)
HDL: 42.5 mg/dL (ref >39.00)
LDL Cholesterol: 135 mg/dL — ABNORMAL HIGH (ref 0–99)
NonHDL: 162.36
Total CHOL/HDL Ratio: 5
Triglycerides: 139 mg/dL (ref 0.0–149.0)
VLDL: 27.8 mg/dL (ref 0.0–40.0)

## 2020-05-30 LAB — HEMOGLOBIN A1C: Hgb A1c MFr Bld: 5.8 % (ref 4.6–6.5)

## 2020-06-06 ENCOUNTER — Other Ambulatory Visit: Payer: Self-pay

## 2020-06-07 ENCOUNTER — Encounter: Payer: Self-pay | Admitting: Family Medicine

## 2020-06-07 ENCOUNTER — Ambulatory Visit (INDEPENDENT_AMBULATORY_CARE_PROVIDER_SITE_OTHER): Payer: 59 | Admitting: Family Medicine

## 2020-06-07 ENCOUNTER — Ambulatory Visit (INDEPENDENT_AMBULATORY_CARE_PROVIDER_SITE_OTHER)
Admission: RE | Admit: 2020-06-07 | Discharge: 2020-06-07 | Disposition: A | Discharge status: Home / Self Care | Payer: 59 | Source: Ambulatory Visit | Attending: Family Medicine | Admitting: Family Medicine

## 2020-06-07 VITALS — BP 122/78 | HR 80 | Temp 97.9°F | Ht 67.0 in | Wt 228.0 lb

## 2020-06-07 DIAGNOSIS — R202 Paresthesia of skin: Secondary | ICD-10-CM

## 2020-06-07 DIAGNOSIS — M545 Low back pain, unspecified: Secondary | ICD-10-CM

## 2020-06-07 DIAGNOSIS — R109 Unspecified abdominal pain: Secondary | ICD-10-CM

## 2020-06-07 DIAGNOSIS — R251 Tremor, unspecified: Secondary | ICD-10-CM

## 2020-06-07 DIAGNOSIS — Z0000 Encounter for general adult medical examination without abnormal findings: Secondary | ICD-10-CM

## 2020-06-07 DIAGNOSIS — G8929 Other chronic pain: Secondary | ICD-10-CM

## 2020-06-07 DIAGNOSIS — Z23 Encounter for immunization: Secondary | ICD-10-CM | POA: Diagnosis not present

## 2020-06-07 DIAGNOSIS — Z85528 Personal history of other malignant neoplasm of kidney: Secondary | ICD-10-CM

## 2020-06-07 DIAGNOSIS — D179 Benign lipomatous neoplasm, unspecified: Secondary | ICD-10-CM

## 2020-06-07 DIAGNOSIS — F411 Generalized anxiety disorder: Secondary | ICD-10-CM

## 2020-06-07 DIAGNOSIS — Z7189 Other specified counseling: Secondary | ICD-10-CM

## 2020-06-07 LAB — POC URINALSYSI DIPSTICK (AUTOMATED)
Bilirubin, UA: NEGATIVE
Blood, UA: NEGATIVE
Glucose, UA: NEGATIVE
Ketones, UA: NEGATIVE
Leukocytes, UA: NEGATIVE
Nitrite, UA: NEGATIVE
Protein, UA: NEGATIVE
Spec Grav, UA: 1.025 (ref 1.010–1.025)
Urobilinogen, UA: 0.2 E.U./dL
pH, UA: 6 (ref 5.0–8.0)

## 2020-06-07 MED ORDER — METOPROLOL SUCCINATE ER 25 MG PO TB24: 25.0000 mg | ORAL_TABLET | Freq: Every day | ORAL | 3 refills | Status: DC

## 2020-06-07 MED ORDER — LISINOPRIL 20 MG PO TABS: 20.0000 mg | ORAL_TABLET | Freq: Every day | ORAL | 3 refills | Status: DC

## 2020-06-07 MED ORDER — PANTOPRAZOLE SODIUM 40 MG PO TBEC: 40.0000 mg | DELAYED_RELEASE_TABLET | Freq: Every day | ORAL | 3 refills | Status: DC

## 2020-06-07 MED ORDER — SERTRALINE HCL 100 MG PO TABS: 50.0000 mg | ORAL_TABLET | Freq: Every day | ORAL | 1 refills | Status: DC

## 2020-06-07 NOTE — Progress Notes (Signed)
This visit occurred during the SARS-CoV-2 public health emergency.  Safety protocols were in place, including screening questions prior to the visit, additional usage of staff PPE, and extensive cleaning of exam room while observing appropriate contact time as indicated for disinfecting solutions.  CPE- See plan.  Routine anticipatory guidance given to patient.  See health maintenance.  The possibility exists that previously documented standard health maintenance information may have been brought forward from a previous encounter into this note.  If needed, that same information has been updated to reflect the current situation based on today's encounter.    Tetanus 2013 Flu shot today  PNA due at 67.  Shingles d/w pt.   covid vaccine encouraged.   Tobacco and etoh discussed, precontemplative on both.  Living will, encouraged. Husband designated if patient were incapacitated.  Mammogram due. D/w pt. See avs.  DXA not due.  Pap 2014.   Colonoscopy 2019, due, d/w pt.   Diet and exercise- d/w pt.  [facility] patient report, so HIV and HCV screening would have been done at that point.  L sided flank/back pain, going on for months.  Constant but variable intensity.  Always in the same area.  No R sided pain.  No rash or bruise.  No blood in urine.  No dysuria.  No blood in stools except for hemorrhoid irritation.  This feels different from prev back pain.  No specific injury.    H/o nephrectomy.  She avoids nsaids, d/w pt.  Graduated from routine f/u.  Urinalysis today, see notes on labs  Hypertension:    Using medication without problems or lightheadedness: yes Chest pain with exertion:no Edema:no Short of breath:no  Tremor at baseline.  Still on beta blocker at baseline.  Compliant  Intermittent tingling in the feet isn't worse, d/w pt about neuropathy, alcohol, smoking.  Mood is good on sertraline and she wanted to continue as is.  No  adverse effect on medication.  PMH and SH reviewed  Meds, vitals, and allergies reviewed.   ROS: Per HPI.  Unless specifically indicated otherwise in HPI, the patient denies:  General: fever. Eyes: acute vision changes ENT: sore throat Cardiovascular: chest pain Respiratory: SOB GI: vomiting GU: dysuria Musculoskeletal: acute back pain Derm: acute rash Neuro: acute motor dysfunction Psych: worsening mood Endocrine: polydipsia Heme: bleeding Allergy: hayfever  GEN: nad, alert and oriented HEENT: NCAT, head tremor baseline. NECK: supple w/o LA CV: rrr. PULM: ctab, no inc wob ABD: soft, +bs EXT: no edema SKIN: no acute rash but lipomas noted on the back and left axilla. Midline back not tender to palpation.

## 2020-06-07 NOTE — Patient Instructions (Addendum)
Call the GI clinic at [phone]. Let me see about getting an appointment with the general surgeons.   Go to the lab on the way out.   If you have mychart we'll likely use that to update you.    Take care.  Glad to see you.  You can call for a mammogram at the [facility] [place] [phone]

## 2020-06-10 ENCOUNTER — Other Ambulatory Visit: Payer: Self-pay | Admitting: Family Medicine

## 2020-06-10 MED ORDER — TIZANIDINE HCL 4 MG PO TABS: 2.0000 mg | ORAL_TABLET | Freq: Three times a day (TID) | ORAL | 1 refills | Status: DC | PRN

## 2020-06-10 NOTE — Assessment & Plan Note (Signed)
Living will, encouraged. Husband designated if patient were incapacitated.

## 2020-06-10 NOTE — Assessment & Plan Note (Signed)
Continue metoprolol. 

## 2020-06-10 NOTE — Assessment & Plan Note (Signed)
Previous work-up noted. Intermittent tingling in the feet isn't worse, d/w pt about neuropathy, alcohol, smoking.  Encouraged gradual cessation.

## 2020-06-10 NOTE — Assessment & Plan Note (Signed)
Refer to general surgery.

## 2020-06-10 NOTE — Assessment & Plan Note (Signed)
Tetanus 2013 Flu shot today  PNA due at 67.  Shingles d/w pt.   covid vaccine encouraged.   Tobacco and etoh discussed, precontemplative on both.  Living will, encouraged. Husband designated if patient were incapacitated.  Mammogram due. D/w pt. See avs.  DXA not due.  Pap 2014.   Colonoscopy 2019, due, d/w pt.   Diet and exercise- d/w pt.  [facility] patient report, so HIV and HCV screening would have been done at that point.

## 2020-06-10 NOTE — Assessment & Plan Note (Signed)
Continue sertraline.  Okay for outpatient follow-up.

## 2020-06-10 NOTE — Assessment & Plan Note (Signed)
Unclear source, unclear if she has recurrent muscle strain.  Abdominal exam is benign.  Not having any gross hematuria or dysuria.  Okay for outpatient follow-up.  See notes on imaging.

## 2020-06-10 NOTE — Assessment & Plan Note (Signed)
See notes on urinalysis. 

## 2020-10-12 ENCOUNTER — Other Ambulatory Visit: Payer: Self-pay

## 2020-10-12 ENCOUNTER — Encounter: Payer: Self-pay | Admitting: Family Medicine

## 2020-10-12 ENCOUNTER — Other Ambulatory Visit: Payer: Self-pay | Admitting: Family Medicine

## 2020-10-12 ENCOUNTER — Ambulatory Visit (INDEPENDENT_AMBULATORY_CARE_PROVIDER_SITE_OTHER): Payer: 59 | Admitting: Family Medicine

## 2020-10-12 VITALS — BP 118/74 | HR 74 | Temp 98.1°F | Ht 67.0 in | Wt 230.0 lb

## 2020-10-12 DIAGNOSIS — R7989 Other specified abnormal findings of blood chemistry: Secondary | ICD-10-CM | POA: Diagnosis not present

## 2020-10-12 DIAGNOSIS — L918 Other hypertrophic disorders of the skin: Secondary | ICD-10-CM

## 2020-10-12 DIAGNOSIS — R202 Paresthesia of skin: Secondary | ICD-10-CM | POA: Diagnosis not present

## 2020-10-12 DIAGNOSIS — L989 Disorder of the skin and subcutaneous tissue, unspecified: Secondary | ICD-10-CM

## 2020-10-12 DIAGNOSIS — R0683 Snoring: Secondary | ICD-10-CM

## 2020-10-12 DIAGNOSIS — IMO0002 Reserved for concepts with insufficient information to code with codable children: Secondary | ICD-10-CM

## 2020-10-12 NOTE — Progress Notes (Addendum)
This visit occurred during the SARS-CoV-2 public health emergency.  Safety protocols were in place, including screening questions prior to the visit, additional usage of staff PPE, and extensive cleaning of exam room while observing appropriate contact time as indicated for disinfecting solutions.  Distal B toes feel cold but they aren't cold. Tingling.  Not in the hands.  Exam with altered senation in the toes but B DP wnl.  Discussed neuropathy differential diagnosis, including possible B12 deficiency.  Discussed alcohol and smoking.  Advised to taper both.  See notes on labs.  Discussed referral for OSA eval.  Snoring, AM fatigue.   Discussed setting her up pulmonary.  Shave biopsy  Meds, vitals, and allergies reviewed.   Indication: Irritated lesion  Location: Right upper chest wall  Size: 6 mm  Informed consent obtained.  Pt aware of risks not limited to but including infection, bleeding, damage to near by organs.  Prep: etoh/betadine  Anesthesia: 1%lidocaine with epi, good effect  Lesion snipped off easily with scissors.  Minimal oozing, controlled with silver nitrate  Tolerated well  Routine postprocedure instructions d/w pt- keep area clean and bandaged, follow up if concerns/spreading erythema/pain.  Meds, vitals, and allergies reviewed.   ROS: Per HPI unless specifically indicated in ROS section   Nad Ncat Rrr Ctab Skin exam with 6 mm lesion noted on the right upper chest wall.  This looks like an irritated skin tag. No ulceration Decreased sensation to light touch on the bilateral toes.  Intact dorsalis pedis pulse bilaterally.

## 2020-10-12 NOTE — Patient Instructions (Addendum)
Go to the lab on the way out.   If you have mychart we'll likely use that to update you.    Keep the area covered.  Update me as needed.

## 2020-10-13 LAB — BASIC METABOLIC PANEL
BUN/Creatinine Ratio: 10 (calc) (ref 6–22)
BUN: 18 mg/dL (ref 7–25)
CO2: 23 mmol/L (ref 20–32)
Calcium: 9.5 mg/dL (ref 8.6–10.4)
Chloride: 105 mmol/L (ref 98–110)
Creat: 1.81 mg/dL — ABNORMAL HIGH (ref 0.50–1.05)
Glucose, Bld: 144 mg/dL — ABNORMAL HIGH (ref 65–99)
Potassium: 4.1 mmol/L (ref 3.5–5.3)
Sodium: 142 mmol/L (ref 135–146)

## 2020-10-13 LAB — VITAMIN B12: Vitamin B-12: 419 pg/mL (ref 200–1100)

## 2020-10-13 LAB — TSH: TSH: 1.46 mIU/L (ref 0.40–4.50)

## 2020-10-14 DIAGNOSIS — R0683 Snoring: Secondary | ICD-10-CM | POA: Insufficient documentation

## 2020-10-14 NOTE — Assessment & Plan Note (Signed)
Referral for OSA eval and treatment.

## 2020-10-14 NOTE — Assessment & Plan Note (Signed)
See above.  Routine postprocedure instructions given to patient.  She can wash gently and keep it covered in the meantime.  Sent for pathology but I expect this to be benign.

## 2020-10-14 NOTE — Assessment & Plan Note (Signed)
Check routine labs.  See notes on results.

## 2020-10-14 NOTE — Addendum Note (Signed)
Addended by: Tonia Ghent on: 10/14/2020 01:31 PM   Modules accepted: Orders

## 2020-10-22 ENCOUNTER — Other Ambulatory Visit: Payer: Self-pay

## 2020-10-22 ENCOUNTER — Other Ambulatory Visit (INDEPENDENT_AMBULATORY_CARE_PROVIDER_SITE_OTHER): Payer: 59

## 2020-10-22 DIAGNOSIS — R7989 Other specified abnormal findings of blood chemistry: Secondary | ICD-10-CM | POA: Diagnosis not present

## 2020-10-22 LAB — BASIC METABOLIC PANEL
BUN: 18 mg/dL (ref 6–23)
CO2: 28 mEq/L (ref 19–32)
Calcium: 9.4 mg/dL (ref 8.4–10.5)
Chloride: 106 mEq/L (ref 96–112)
Creatinine, Ser: 1.3 mg/dL — ABNORMAL HIGH (ref 0.40–1.20)
GFR: 45.45 mL/min — ABNORMAL LOW (ref >60.00)
Glucose, Bld: 115 mg/dL — ABNORMAL HIGH (ref 70–99)
Potassium: 4.2 mEq/L (ref 3.5–5.1)
Sodium: 142 mEq/L (ref 135–145)

## 2020-10-23 NOTE — Progress Notes (Signed)
10/24/20- 89 yoF Smoker (40 pkyrs)for sleep evaluation with concern of snoring, courtesy of Elsie Stain, MD. Medical problem list includes HTN, OSA, GERD/ Dysphagia, Eustachian Dysfunction, Tobacco Abuse, Hyperlipidemia, Hx Renal Cell Cancer,/  Left Nephrectomy,   HST 08/13/14- AHI 14/ hr, desat to 81.5% HST 02/14/18- AHI 41.6/ hr, desat to 86% Epworth score- 18 Body weight today-230 lbs Covid vax-none -----Ref. By Dr. Damita Dunnings for snoring. Tired all the time She remembers trying CPAP for awhile but couldn't keep mask seal. Gave up without seeking refit. Aware of head-bob and sometimes will cry out in dream, but no significant movement disorder or complex parasomnia. Works for a [employer] from [employer]. HS around 8-9PM. No sleep meds.  2-3 sodas or tea/ day, no coffee.  Denies ENT surgery, heart or lung disease or thyroid problem. 2 brothers on CPAP.   Prior to Admission medications   Medication Sig Start Date End Date Taking? Authorizing Provider  acetaminophen (TYLENOL) 325 MG tablet Take 650 mg by mouth every 6 (six) hours as needed.   Yes [provider]  aspirin EC 81 MG tablet Take 81 mg by mouth daily.   Yes [provider]  lisinopril (ZESTRIL) 20 MG tablet Take 1 tablet (20 mg total) by mouth daily. 06/07/20  Yes Tonia Ghent, MD  metoprolol succinate (TOPROL-XL) 25 MG 24 hr tablet Take 1 tablet (25 mg total) by mouth daily. 06/07/20  Yes Tonia Ghent, MD  Multiple Vitamin (MULTIVITAMIN) tablet Take 1 tablet by mouth daily.   Yes [provider]  pantoprazole (PROTONIX) 40 MG tablet Take 1 tablet (40 mg total) by mouth daily. 06/07/20  Yes Tonia Ghent, MD  sertraline (ZOLOFT) 100 MG tablet Take 0.5 tablets (50 mg total) by mouth daily. 06/07/20  Yes Tonia Ghent, MD   Past Medical History:  Diagnosis Date   Anxiety    Elevated LFTs    GERD (gastroesophageal reflux disease)    History of hiatal hernia    HTN (hypertension)    Plantar  fasciitis    Right   Pure hypercholesterolemia    Sleep apnea    in home sleep study; OSA; pt does not have CPAP   Tobacco abuse    Tremor    Tubular adenoma of colon    Wears glasses    Past Surgical History:  Procedure Laterality Date   LIPOMA EXCISION N/A 10/31/2015   Procedure: OPEN EXCISION LIPOMA;  Surgeon: Alexis Frock, MD;  Location: [facility];  Service: Urology;  Laterality: N/A;   PELVIC LYMPH NODE DISSECTION Bilateral 10/31/2015   Procedure: PELVIC LYMPH NODE DISSECTION;  Surgeon: Alexis Frock, MD;  Location: [facility];  Service: Urology;  Laterality: Bilateral;   ROBOT ASSISTED LAPAROSCOPIC NEPHRECTOMY Left 10/31/2015   Procedure: XI ROBOTIC ASSISTED LAPAROSCOPIC NEPHRECTOMY;  Surgeon: Alexis Frock, MD;  Location: [facility];  Service: Urology;  Laterality: Left;   TUBAL LIGATION  1995   after pregnancy with blighted ovum   Family History  Problem Relation Age of Onset   Heart attack Father 14   Heart disease Father    Coronary artery disease Father    Heart attack Mother    Coronary artery disease Mother    Diabetes Mother    Heart disease Mother    Diabetes Brother    Kidney cancer Brother    Breast cancer Neg Hx    Colon cancer Neg Hx    Colon polyps Neg Hx    Rectal cancer Neg Hx  Stomach cancer Neg Hx    Esophageal cancer Neg Hx    Social History   Socioeconomic History   Marital status: Married    Spouse name: Not on file   Number of children: 0   Years of education: Not on file   Highest education level: Not on file  Occupational History   Occupation: Waitress-[employer]  Tobacco Use   Smoking status: Every Day    Packs/day: 1.00    Years: 40.00    Pack years: 40.00    Types: Cigarettes   Smokeless tobacco: Never  Substance and Sexual Activity   Alcohol use: Yes    Alcohol/week: 3.0 standard drinks    Types: 3 Standard drinks or equivalent per week    Comment: 5th per week.     Drug use: No   Sexual activity: Not on file  Other Topics  Concern   Not on file  Social History Narrative   Married since 1987; lives with husband   No children   Waitress at [employer]   Only exercise is at work   Social Determinants of Radio broadcast assistant Strain: Not on file  Food Insecurity: Not on file  Transportation Needs: Not on file  Physical Activity: Not on file  Stress: Not on file  Social Connections: Not on file  Intimate Partner Violence: Not on file   ROS-see HPI   + = positive Constitutional:    weight loss, night sweats, fevers, chills, +fatigue, lassitude. HEENT:    headaches, difficulty swallowing, tooth/dental problems, sore throat,       sneezing, itching, ear ache, nasal congestion, post nasal drip, snoring CV:    chest pain, orthopnea, PND, swelling in lower extremities, anasarca,        dizziness, palpitations Resp:   shortness of breath with exertion or at rest.                productive cough,   non-productive cough, coughing up of blood.              change in color of mucus.  wheezing.   Skin:    rash or lesions. GI:  No-   heartburn, indigestion, abdominal pain, nausea, vomiting, diarrhea,                 change in bowel habits, loss of appetite GU: dysuria, change in color of urine, no urgency or frequency.   flank pain. MS:   joint pain, stiffness, decreased range of motion, back pain. Neuro-     nothing unusual Psych:  change in mood or affect.  depression or anxiety.   memory loss.   OBJ- Physical Exam General- Alert, Oriented, Affect-appropriate, Distress- none acute, +Obese Skin- rash-none, lesions- none, excoriation- none Lymphadenopathy- none Head- atraumatic            Eyes- Gross vision intact, PERRLA, conjunctivae and secretions clear            Ears- Hearing, canals-normal            Nose- Clear, no-Septal dev, mucus, polyps, erosion, perforation             Throat- Mallampati IV , mucosa clear , drainage- none, tonsils- atrophic, + teeth Neck- flexible , trachea midline, no  stridor , thyroid nl, carotid no bruit Chest - symmetrical excursion , unlabored           Heart/CV- RRR , no murmur , no gallop  , no rub, nl s1  s2                           - JVD- none , edema- none, stasis changes- none, varices- none           Lung- clear to P&A, wheeze- none, cough- none , dullness-none, rub- none           Chest wall-  Abd-  Br/ Gen/ Rectal- Not done, not indicated Extrem- cyanosis- none, clubbing, none, atrophy- none, strength- nl Neuro- +head-bob tremor

## 2020-10-24 ENCOUNTER — Encounter: Payer: Self-pay | Admitting: Internal Medicine

## 2020-10-24 ENCOUNTER — Ambulatory Visit: Payer: 59 | Admitting: Internal Medicine

## 2020-10-24 ENCOUNTER — Other Ambulatory Visit: Payer: Self-pay

## 2020-10-24 VITALS — BP 144/78 | HR 68 | Temp 98.7°F | Ht 67.0 in | Wt 230.6 lb

## 2020-10-24 DIAGNOSIS — Z720 Tobacco use: Secondary | ICD-10-CM

## 2020-10-24 DIAGNOSIS — G4733 Obstructive sleep apnea (adult) (pediatric): Secondary | ICD-10-CM | POA: Diagnosis not present

## 2020-10-24 NOTE — Assessment & Plan Note (Signed)
We will have smoking cessation program established here soon, anticipating expansion for [facility] system. She isn't aware of consequent lung problems, but likely will benefit from PFT in future.

## 2020-10-24 NOTE — Assessment & Plan Note (Signed)
She needed more help with mask and CPAP tolerance before. 2 brothers like their CPAP machines, so she understands potential. We reviewed medical concerns and physiology of OSA, driving responsibility, treatment options. CPAP is likely the best choice for her again.  Plan- sleep study, then likely CPAP with emphasis on mask fitting

## 2020-10-24 NOTE — Patient Instructions (Signed)
Order- schedule home sleep test      dx OSA  Please call us about 2 weeks after your sleep study for results and recommendations.

## 2020-11-12 ENCOUNTER — Telehealth: Payer: Self-pay | Admitting: Internal Medicine

## 2020-11-16 NOTE — Telephone Encounter (Signed)
Nichole [place].  Did you ever get an update on this one?

## 2020-11-20 NOTE — Telephone Encounter (Signed)
Called insurance and reconsideration was approved then spoke to husband he will relay the info to her that she will be getting a call to get up [place]

## 2020-11-26 ENCOUNTER — Other Ambulatory Visit: Payer: Self-pay

## 2020-11-26 ENCOUNTER — Ambulatory Visit: Payer: 59

## 2020-11-26 DIAGNOSIS — G4733 Obstructive sleep apnea (adult) (pediatric): Secondary | ICD-10-CM

## 2020-12-06 ENCOUNTER — Telehealth: Payer: Self-pay

## 2020-12-06 ENCOUNTER — Encounter (HOSPITAL_COMMUNITY): Payer: Self-pay | Admitting: *Deleted

## 2020-12-06 ENCOUNTER — Other Ambulatory Visit: Payer: Self-pay

## 2020-12-06 ENCOUNTER — Emergency Department (HOSPITAL_COMMUNITY)
Admission: EM | Admit: 2020-12-06 | Discharge: 2020-12-06 | Disposition: A | Discharge status: Home / Self Care | Payer: 59 | Attending: Emergency Medicine | Admitting: Emergency Medicine

## 2020-12-06 ENCOUNTER — Emergency Department (HOSPITAL_COMMUNITY): Payer: 59

## 2020-12-06 DIAGNOSIS — Z85038 Personal history of other malignant neoplasm of large intestine: Secondary | ICD-10-CM | POA: Diagnosis not present

## 2020-12-06 DIAGNOSIS — Z79899 Other long term (current) drug therapy: Secondary | ICD-10-CM | POA: Diagnosis not present

## 2020-12-06 DIAGNOSIS — K529 Noninfective gastroenteritis and colitis, unspecified: Secondary | ICD-10-CM | POA: Insufficient documentation

## 2020-12-06 DIAGNOSIS — F17210 Nicotine dependence, cigarettes, uncomplicated: Secondary | ICD-10-CM | POA: Insufficient documentation

## 2020-12-06 DIAGNOSIS — K5900 Constipation, unspecified: Secondary | ICD-10-CM | POA: Diagnosis not present

## 2020-12-06 DIAGNOSIS — Z8553 Personal history of malignant neoplasm of renal pelvis: Secondary | ICD-10-CM | POA: Insufficient documentation

## 2020-12-06 DIAGNOSIS — Z7982 Long term (current) use of aspirin: Secondary | ICD-10-CM | POA: Insufficient documentation

## 2020-12-06 DIAGNOSIS — I10 Essential (primary) hypertension: Secondary | ICD-10-CM | POA: Insufficient documentation

## 2020-12-06 DIAGNOSIS — K921 Melena: Secondary | ICD-10-CM | POA: Insufficient documentation

## 2020-12-06 LAB — COMPREHENSIVE METABOLIC PANEL
ALT: 74 U/L — ABNORMAL HIGH (ref 0–44)
AST: 75 U/L — ABNORMAL HIGH (ref 15–41)
Albumin: 4.2 g/dL (ref 3.5–5.0)
Alkaline Phosphatase: 90 U/L (ref 38–126)
Anion gap: 9 (ref 5–15)
BUN: 19 mg/dL (ref 6–20)
CO2: 28 mmol/L (ref 22–32)
Calcium: 9.9 mg/dL (ref 8.9–10.3)
Chloride: 106 mmol/L (ref 98–111)
Creatinine, Ser: 1.02 mg/dL — ABNORMAL HIGH (ref 0.44–1.00)
GFR, Estimated: >60 mL/min (ref >60)
Glucose, Bld: 106 mg/dL — ABNORMAL HIGH (ref 70–99)
Potassium: 4.3 mmol/L (ref 3.5–5.1)
Sodium: 143 mmol/L (ref 135–145)
Total Bilirubin: 0.5 mg/dL (ref 0.3–1.2)
Total Protein: 7.2 g/dL (ref 6.5–8.1)

## 2020-12-06 LAB — URINALYSIS, ROUTINE W REFLEX MICROSCOPIC
Bilirubin Urine: NEGATIVE
Glucose, UA: NEGATIVE mg/dL
Hgb urine dipstick: NEGATIVE
Ketones, ur: NEGATIVE mg/dL
Leukocytes,Ua: NEGATIVE
Nitrite: NEGATIVE
Protein, ur: NEGATIVE mg/dL
Specific Gravity, Urine: 1.016 (ref 1.005–1.030)
pH: 6 (ref 5.0–8.0)

## 2020-12-06 LAB — CBC
HCT: 45.4 % (ref 36.0–46.0)
Hemoglobin: 14.7 g/dL (ref 12.0–15.0)
MCH: 30.1 pg (ref 26.0–34.0)
MCHC: 32.4 g/dL (ref 30.0–36.0)
MCV: 92.8 fL (ref 80.0–100.0)
Platelets: 136 10*3/uL — ABNORMAL LOW (ref 150–400)
RBC: 4.89 MIL/uL (ref 3.87–5.11)
RDW: 13.9 % (ref 11.5–15.5)
WBC: 9.2 10*3/uL (ref 4.0–10.5)
nRBC: 0 % (ref 0.0–0.2)

## 2020-12-06 LAB — POC OCCULT BLOOD, ED: Fecal Occult Bld: POSITIVE — AB

## 2020-12-06 LAB — I-STAT BETA HCG BLOOD, ED (MC, WL, AP ONLY): I-stat hCG, quantitative: <5 m[IU]/mL (ref <5)

## 2020-12-06 LAB — LIPASE, BLOOD: Lipase: 43 U/L (ref 11–51)

## 2020-12-06 MED ORDER — POLYETHYLENE GLYCOL 3350 17 GM/SCOOP PO POWD: 17.0000 g | Freq: Every day | ORAL | 0 refills | Status: AC

## 2020-12-06 NOTE — ED Notes (Signed)
ED provider at the bedside to evaluate.  

## 2020-12-06 NOTE — ED Provider Notes (Signed)
[facility] DEPT Provider Note   CSN: [id number] Arrival date & time: 12/06/20  0801     History Chief Complaint  Patient presents with   Blood In [place] is a 60 y.o. female.  This is a 60 year old female with history including renal cell carcinoma, singleton kidney presenting for rectal bleeding.  Patient reports she is regularly constipated.  Typically has bowel movement every 3-4 days.  Yesterday evening she was straining for a bowel movement and experienced pain with attempt at defecation, did not defecate. This morning she attempted to have another bowel movement and a small amount of stool was extracted.  Also had some bright red blood mixed with her stool.  Denies bleeding between bowel movements.  Not anticoagulated.  Straining is associated with bilateral lower quadrant abdominal discomfort.  Nausea or vomiting.  She is tolerant p.o. without difficulty.  No change to bladder function.  No history of rectal trauma.  Last colonoscopy approximately 4 years ago, multiple polyps removed per pt. She is tobacco user. She has not experienced rectal bleeding like this in the past. She is chronically constipated.   The history is provided by the patient. No language interpreter was used.  Constipation Time since last bowel movement:  1 day Timing:  Intermittent Chronicity:  Chronic Stool description:  Hard and pellet like Associated symptoms: abdominal pain   Associated symptoms: no back pain, no dysuria, no fever and no nausea       Past Medical History:  Diagnosis Date   Anxiety    Elevated LFTs    GERD (gastroesophageal reflux disease)    History of hiatal hernia    HTN (hypertension)    Plantar fasciitis    Right   Pure hypercholesterolemia    Sleep apnea    in home sleep study; OSA; pt does not have CPAP   Tobacco abuse    Tremor    Tubular adenoma of colon    Wears glasses     Patient Active Problem List   Diagnosis  Date Noted   Snoring 10/14/2020   Paresthesia 05/22/2019   Advance care planning 12/03/2017   Tremor 12/03/2017   GERD (gastroesophageal reflux disease) 12/03/2017   Neck pain 01/29/2016   H/O renal cell cancer 08/29/2015   Chronic low back pain 11/21/2014   Dysphagia 10/02/2014   Hyperglycemia 09/20/2014   OSA (obstructive sleep apnea) 05/17/2014   Skin lesion 05/26/2013   Back pain 05/26/2013   Alcohol use 12/07/2011   Routine general medical examination at a health care facility 12/07/2011   Lipoma 12/07/2011   Chest pain 10/11/2010   Aorta disorder ([place]) 10/10/2010   Knee pain 10/10/2010   Fatty liver 10/10/2010   PLANTAR FASCIITIS, RIGHT 06/24/2007   HYPERCHOLESTEROLEMIA 06/18/2007   Anxiety state 03/08/2007   ESSENTIAL HYPERTENSION, BENIGN 03/08/2007   Tobacco abuse 09/04/1978    Past Surgical History:  Procedure Laterality Date   LIPOMA EXCISION N/A 10/31/2015   Procedure: OPEN EXCISION LIPOMA;  Surgeon: Alexis Frock, MD;  Location: [facility];  Service: Urology;  Laterality: N/A;   PELVIC LYMPH NODE DISSECTION Bilateral 10/31/2015   Procedure: PELVIC LYMPH NODE DISSECTION;  Surgeon: Alexis Frock, MD;  Location: [facility];  Service: Urology;  Laterality: Bilateral;   ROBOT ASSISTED LAPAROSCOPIC NEPHRECTOMY Left 10/31/2015   Procedure: XI ROBOTIC ASSISTED LAPAROSCOPIC NEPHRECTOMY;  Surgeon: Alexis Frock, MD;  Location: [facility];  Service: Urology;  Laterality: Left;   TUBAL LIGATION  1995   after pregnancy with blighted ovum     OB History   No obstetric history on file.     Family History  Problem Relation Age of Onset   Heart attack Father 21   Heart disease Father    Coronary artery disease Father    Heart attack Mother    Coronary artery disease Mother    Diabetes Mother    Heart disease Mother    Diabetes Brother    Kidney cancer Brother    Breast cancer Neg Hx    Colon cancer Neg Hx    Colon polyps Neg Hx    Rectal cancer Neg Hx    Stomach cancer Neg  Hx    Esophageal cancer Neg Hx     Social History   Tobacco Use   Smoking status: Every Day    Packs/day: 1.00    Years: 40.00    Pack years: 40.00    Types: Cigarettes   Smokeless tobacco: Never  Vaping Use   Vaping Use: Never used  Substance Use Topics   Alcohol use: Yes    Alcohol/week: 3.0 standard drinks    Types: 3 Standard drinks or equivalent per week    Comment: 5th per week.     Drug use: No    Home Medications Prior to Admission medications   Medication Sig Start Date End Date Taking? Authorizing Provider  polyethylene glycol powder (MIRALAX) 17 GM/SCOOP powder Take 17 g by mouth daily for 5 days. Stop taking if diarrhea begins 12/06/20 12/11/20 Yes Wynona Dove A, DO  acetaminophen (TYLENOL) 325 MG tablet Take 650 mg by mouth every 6 (six) hours as needed.    [provider]  aspirin EC 81 MG tablet Take 81 mg by mouth daily.    [provider]  lisinopril (ZESTRIL) 20 MG tablet Take 1 tablet (20 mg total) by mouth daily. 06/07/20   Tonia Ghent, MD  metoprolol succinate (TOPROL-XL) 25 MG 24 hr tablet Take 1 tablet (25 mg total) by mouth daily. 06/07/20   Tonia Ghent, MD  Multiple Vitamin (MULTIVITAMIN) tablet Take 1 tablet by mouth daily.    [provider]  pantoprazole (PROTONIX) 40 MG tablet Take 1 tablet (40 mg total) by mouth daily. 06/07/20   Tonia Ghent, MD  sertraline (ZOLOFT) 100 MG tablet Take 0.5 tablets (50 mg total) by mouth daily. 06/07/20   Tonia Ghent, MD    Allergies    Aleve [naproxen sodium], Ibuprofen, and Nsaids  Review of Systems   Review of Systems  Constitutional:  Negative for activity change and fever.  HENT:  Negative for facial swelling and trouble swallowing.   Eyes:  Negative for discharge and redness.  Respiratory:  Negative for cough and shortness of breath.   Cardiovascular:  Negative for chest pain and palpitations.  Gastrointestinal:  Positive for abdominal pain, blood in stool and  constipation. Negative for nausea.  Genitourinary:  Negative for dysuria and flank pain.  Musculoskeletal:  Negative for back pain and gait problem.  Skin:  Negative for pallor and rash.  Neurological:  Negative for syncope and headaches.   Physical Exam Updated Vital Signs BP (!) 171/78 (BP Location: Left Arm)   Pulse 65   Temp 98.7 F (37.1 C) (Oral)   Resp 15   Ht '5\' 7"'$  (1.702 m)   Wt 104.3 kg   LMP 01/19/2013   SpO2 96%   BMI 36.02 kg/m   Physical Exam Vitals  and nursing note reviewed.  Constitutional:      General: She is not in acute distress.    Appearance: Normal appearance.  HENT:     Head: Normocephalic and atraumatic.     Right Ear: External ear normal.     Left Ear: External ear normal.     Nose: Nose normal.     Mouth/Throat:     Mouth: Mucous membranes are moist.  Eyes:     General: No scleral icterus.       Right eye: No discharge.        Left eye: No discharge.     Comments: No conjunctival pallor  Cardiovascular:     Rate and Rhythm: Normal rate and regular rhythm.     Pulses: Normal pulses.     Heart sounds: Normal heart sounds.  Pulmonary:     Effort: Pulmonary effort is normal. No respiratory distress.     Breath sounds: Normal breath sounds.  Abdominal:     General: Abdomen is flat.     Tenderness: There is abdominal tenderness in the right lower quadrant and left lower quadrant.     Comments: Nonperitoneal  Genitourinary:    Comments: No frank blood per rectum, no external hemorrhoids or fissures appreciated. No stool in rectal vault. Scant blood appreciated on digital exam.  Musculoskeletal:        General: Normal range of motion.     Cervical back: Normal range of motion.     Right lower leg: No edema.     Left lower leg: No edema.  Skin:    General: Skin is warm and dry.     Capillary Refill: Capillary refill takes less than 2 seconds.  Neurological:     Mental Status: She is alert.  Psychiatric:        Mood and Affect: Mood  normal.        Behavior: Behavior normal.    ED Results / Procedures / Treatments   Labs (all labs ordered are listed, but only abnormal results are displayed) Labs Reviewed  COMPREHENSIVE METABOLIC PANEL - Abnormal; Notable for the following components:      Result Value   Glucose, Bld 106 (*)    Creatinine, Ser 1.02 (*)    AST 75 (*)    ALT 74 (*)    All other components within normal limits  CBC - Abnormal; Notable for the following components:   Platelets 136 (*)    All other components within normal limits  URINALYSIS, ROUTINE W REFLEX MICROSCOPIC - Abnormal; Notable for the following components:   APPearance CLOUDY (*)    All other components within normal limits  POC OCCULT BLOOD, ED - Abnormal; Notable for the following components:   Fecal Occult Bld POSITIVE (*)    All other components within normal limits  LIPASE, BLOOD  I-STAT BETA HCG BLOOD, ED ([facility], [facility], [facility] ONLY)    EKG None  Radiology CT ABDOMEN PELVIS WO CONTRAST  Result Date: 12/06/2020 CLINICAL DATA:  Left lower quadrant pain EXAM: CT ABDOMEN AND PELVIS WITHOUT CONTRAST TECHNIQUE: Multidetector CT imaging of the abdomen and pelvis was performed following the standard protocol without IV contrast. COMPARISON:  Multiple priors, most recent CT abdomen and pelvis dated November 27, 2017 FINDINGS: Lower chest: No acute abnormality. Hepatobiliary: Hepatic steatosis. Normal gallbladder. No evidence of biliary ductal dilation. Pancreas: Unremarkable. No pancreatic ductal dilatation or surrounding inflammatory changes. Spleen: Normal in size without focal abnormality. Adrenals/Urinary Tract: Status post left nephrectomy. Left  adrenal gland is not well visualized and may also be surgically absent. Normal appearing right adrenal gland and kidney. Normal appearance of the bladder. Stomach/Bowel: Normal stomach wall thickening and inflammatory change of the left colon. No significant diverticular disease. Normal appearance of the small  bowel. Vascular/Lymphatic: Aortic atherosclerosis. No enlarged abdominal or pelvic lymph nodes. Reproductive: Uterus and bilateral adnexa are unremarkable. Other: Small fat containing umbilical hernia. Unchanged margins low-density lesion located anterior to the right psoas muscle, measures up to 1.9 cm and is best seen on series 2, image 57. Musculoskeletal: No acute or significant osseous findings. IMPRESSION: Wall thickening of the left colon with surrounding inflammatory change, findings can be seen in the setting of an infectious, inflammatory or ischemic colitis. Hepatic steatosis. Unchanged homogeneous low-density lesion anterior to the right psoas muscle, has been present dating back to 2016 prior exam. Aortic Atherosclerosis (ICD10-I70.0). Electronically Signed   By: Yetta Glassman MD   On: 12/06/2020 10:17    Procedures Procedures   Medications Ordered in ED Medications - No data to display  ED Course  I have reviewed the triage vital signs and the nursing notes.  Pertinent labs & imaging results that were available during my care of the patient were reviewed by me and considered in my medical decision making (see chart for details).    MDM Rules/Calculators/A&P                          This is 60 yo female with history above presenting for rectal pain and blood in stool. Blood pressure is elevated, vital signs otherwise unremarkable. She is non-toxic appearing. Abdomen is non-peritoneal, mildly tender upon palpation. Serious etiology considered.  Rectal exam without appreciable hemorrhoid or fissure/tear. No stool in rectal vault. Scant amount of blood on exam.  Labs reviewed: Hemoccult positive, Cr is minimally elevated yet greatly improved from baseline, tolerating oral rehydration. Hemoglobin is stable. No recent abx, no fevers. No hx C-diff.  CT abdomen/pelvis without IV contrast notable for chronic lesion to left psoas. Other findings concerning for possible colitis.    Favor constipation as etiology of blood per rectum- she has not experienced any rectal bleeding while in the ER.  Given lack of diarrhea or fever doubt overt colitis, bacterial colitis less likely.   10:30 AM  Discussed dietary changes with the patient, stool softener/laxative use. O/p f/u with GI. Patient stable overall and symptoms are well controlled. No acute distress currently. Discussed strict return precautions with patient and advised her to RTED for any worsening or worrisome symptoms. She is agreeable to plan. Stable for discharge currently.     Final Clinical Impression(s) / ED Diagnoses Final diagnoses:  Blood in stool  Constipation, unspecified constipation type  Colitis with rectal bleeding    Rx / DC Orders ED Discharge Orders          Ordered    polyethylene glycol powder (MIRALAX) 17 GM/SCOOP powder  Daily        12/06/20 1033             Jeanell Sparrow, DO 12/06/20 1522

## 2020-12-06 NOTE — ED Triage Notes (Signed)
Pt states during th night she had a urge to have a BM, could not. This morning had a BM with bright red blood in it. C/o pain in lower abd area. No N/v

## 2020-12-06 NOTE — Telephone Encounter (Signed)
[facility] Night - Client TELEPHONE ADVICE RECORD [facility] Patient Name: Nichole Burke Gender: Female DOB: Oct 22, 1960 Age: 60 Y 3 M 2 D Return Phone Number: [phone] (Primary), [phone] (Secondary) Address: City/ State/ Zip: [place] [address] Client [place] Night - Client Client Site [facility] Physician Renford Dills - MD Contact Type Call Who Is Calling Patient / Member / Family / Caregiver Call Type Triage / Clinical Relationship To Patient Self Return Phone Number [phone] (Primary) Chief Complaint Blood In Stool Reason for Call Request to Schedule Office Appointment Initial Comment Caller needs to get an appt for today, she has blood in her stool. Translation No Nurse Assessment Nurse: Jimmye Norman, RN, Whitney Date/Time (Eastern Time): 12/06/2020 7:23:10 AM Confirm and document reason for call. If symptomatic, describe symptoms. ---Caller needs to get an appointment for today, she has blood in her stool that started last night, and now she is having pure blood come out. Does the patient have any new or worsening symptoms? ---Yes Will a triage be completed? ---Yes Related visit to physician within the last 2 weeks? ---No Does the PT have any chronic conditions? (i.e. diabetes, asthma, this includes High risk factors for pregnancy, etc.) ---Yes List chronic conditions. ---hypertension, depression Is this a behavioral health or substance abuse call? ---No Guidelines Guideline Title Affirmed Question Affirmed Notes Nurse Date/Time (Eastern Time) Rectal Bleeding [1] MODERATE rectal bleeding (small blood clots, passing blood without stool, or toilet water turns red) AND [2] more than once a day Jimmye Norman, RN, Whitney 12/06/2020 7:23:55 AM Disp. Time Eilene Ghazi Time) Disposition Final User PLEASE NOTE: All timestamps contained within this report are represented as  [place] Standard Time. CONFIDENTIALTY NOTICE: This fax transmission is intended only for the addressee. It contains information that is legally privileged, confidential or otherwise protected from use or disclosure. If you are not the intended recipient, you are strictly prohibited from reviewing, disclosing, copying using or disseminating any of this information or taking any action in reliance on or regarding this information. If you have received this fax in error, please notify us immediately by telephone so that we can arrange for its return to [place]. Phone: [phone], Toll-Free: [phone], Fax: [phone] Page: 2 of 2 Call Id: [id number] 12/06/2020 7:25:41 AM Go to ED Now Yes Jimmye Norman, RN, Whitney Caller Disagree/Comply Comply Caller Understands Yes PreDisposition InappropriateToAsk Care Advice Given Per Guideline GO TO ED NOW: * You need to be seen in the Emergency Department. Referrals [facility] - ED

## 2020-12-06 NOTE — Telephone Encounter (Signed)
Per chart review tab pt is presently at [facility] ED. Sending note to DR Damita Dunnings.

## 2020-12-07 ENCOUNTER — Telehealth: Payer: Self-pay | Admitting: Family Medicine

## 2020-12-07 DIAGNOSIS — K529 Noninfective gastroenteritis and colitis, unspecified: Secondary | ICD-10-CM

## 2020-12-07 NOTE — Telephone Encounter (Signed)
Patient has an appt with GI on 01/10/21. Do you want her to be seen sooner before I call her?

## 2020-12-07 NOTE — Telephone Encounter (Signed)
ER note reviewed.  See follow-up phone note.

## 2020-12-07 NOTE — Telephone Encounter (Signed)
Yes.  Sooner if possible.  Thanks.

## 2020-12-07 NOTE — Telephone Encounter (Signed)
Please check on patient.  I want her to f/u with GI.  If she needs a referral, then let me know.  Thanks.

## 2020-12-10 ENCOUNTER — Ambulatory Visit (INDEPENDENT_AMBULATORY_CARE_PROVIDER_SITE_OTHER): Payer: 59 | Admitting: Family Medicine

## 2020-12-10 ENCOUNTER — Other Ambulatory Visit: Payer: Self-pay

## 2020-12-10 ENCOUNTER — Encounter: Payer: Self-pay | Admitting: Family Medicine

## 2020-12-10 VITALS — BP 120/78 | HR 77 | Temp 97.6°F | Ht 67.0 in | Wt 226.0 lb

## 2020-12-10 DIAGNOSIS — R059 Cough, unspecified: Secondary | ICD-10-CM

## 2020-12-10 DIAGNOSIS — K529 Noninfective gastroenteritis and colitis, unspecified: Secondary | ICD-10-CM

## 2020-12-10 DIAGNOSIS — G4733 Obstructive sleep apnea (adult) (pediatric): Secondary | ICD-10-CM | POA: Diagnosis not present

## 2020-12-10 LAB — COMPREHENSIVE METABOLIC PANEL
ALT: 47 U/L — ABNORMAL HIGH (ref 0–35)
AST: 33 U/L (ref 0–37)
Albumin: 3.9 g/dL (ref 3.5–5.2)
Alkaline Phosphatase: 84 U/L (ref 39–117)
BUN: 20 mg/dL (ref 6–23)
CO2: 25 mEq/L (ref 19–32)
Calcium: 9.1 mg/dL (ref 8.4–10.5)
Chloride: 106 mEq/L (ref 96–112)
Creatinine, Ser: 1.25 mg/dL — ABNORMAL HIGH (ref 0.40–1.20)
GFR: 47.59 mL/min — ABNORMAL LOW (ref >60.00)
Glucose, Bld: 90 mg/dL (ref 70–99)
Potassium: 4.1 mEq/L (ref 3.5–5.1)
Sodium: 140 mEq/L (ref 135–145)
Total Bilirubin: 0.3 mg/dL (ref 0.2–1.2)
Total Protein: 6.5 g/dL (ref 6.0–8.3)

## 2020-12-10 LAB — CBC WITH DIFFERENTIAL/PLATELET
Basophils Absolute: 0 10*3/uL (ref 0.0–0.1)
Basophils Relative: 0.4 % (ref 0.0–3.0)
Eosinophils Absolute: 0.1 10*3/uL (ref 0.0–0.7)
Eosinophils Relative: 1.6 % (ref 0.0–5.0)
HCT: 41.1 % (ref 36.0–46.0)
Hemoglobin: 13.8 g/dL (ref 12.0–15.0)
Lymphocytes Relative: 30.4 % (ref 12.0–46.0)
Lymphs Abs: 2.7 10*3/uL (ref 0.7–4.0)
MCHC: 33.5 g/dL (ref 30.0–36.0)
MCV: 89.9 fl (ref 78.0–100.0)
Monocytes Absolute: 0.7 10*3/uL (ref 0.1–1.0)
Monocytes Relative: 7.2 % (ref 3.0–12.0)
Neutro Abs: 5.5 10*3/uL (ref 1.4–7.7)
Neutrophils Relative %: 60.4 % (ref 43.0–77.0)
Platelets: 138 10*3/uL — ABNORMAL LOW (ref 150.0–400.0)
RBC: 4.57 Mil/uL (ref 3.87–5.11)
RDW: 14 % (ref 11.5–15.5)
WBC: 9 10*3/uL (ref 4.0–10.5)

## 2020-12-10 MED ORDER — AMOXICILLIN-POT CLAVULANATE 875-125 MG PO TABS: 1.0000 | ORAL_TABLET | Freq: Two times a day (BID) | ORAL | 0 refills | Status: DC

## 2020-12-10 NOTE — Telephone Encounter (Signed)
I put in the referral.  Thanks.  

## 2020-12-10 NOTE — Progress Notes (Signed)
This visit occurred during the SARS-CoV-2 public health emergency.  Safety protocols were in place, including screening questions prior to the visit, additional usage of staff PPE, and extensive cleaning of exam room while observing appropriate contact time as indicated for disinfecting solutions.  ER f/u.  She was passing BRBPR, clots on blood, the toilet water was diffusely red.  Went on for 2 days.  Started on 12/06/20, went to ER.   CT and labs d/w pt.  No fevers.  L sided abd discomfort, better but not resolved.  Still with some occ LUQ pain, deep to the lower rib margin.  No upper mildine abd pain.  No vomiting.  No BLE edema.    No blood in stool now stopped as of 2 days ago.  Still with BMs in the meantime.  Taking miralax, having soft stools.  D/w pt about cutting miralax back to QOD.    She had seen Dr. Stefanie Libel.  Needs GI appointment sooner than currently scheduled.  Discussed with patient about gradual smoking in alcohol taper/cessation.  She has had some R sided frontal and maxillary pain- better in the last day or so.  No fevers.  Some cough.    Meds, vitals, and allergies reviewed.   ROS: Per HPI unless specifically indicated in ROS section   Nad Ncat OP wnl.  TM wnl B.  MMM, R max sinus and frontal sinus slightly tender to palpation. Neck supple, no LA Rrr Ctab Abdomen soft, No rebound. Normal BS, slightly tender to palpation left upper quadrant Skin well perfused.  33 minutes were devoted to patient care in this encounter (this includes time spent reviewing the patient's file/history, interviewing and examining the patient, counseling/reviewing plan with patient).

## 2020-12-10 NOTE — Telephone Encounter (Signed)
GI office will need an urgent referral done if patient needs appt moved up. 01/10/21 was the first available appt they had when patient called for appt.

## 2020-12-10 NOTE — Addendum Note (Signed)
Addended by: Tonia Ghent on: 12/10/2020 01:59 PM   Modules accepted: Orders

## 2020-12-10 NOTE — Telephone Encounter (Signed)
Referral sent as urgent to [facility] - Dr Hilarie Fredrickson  The patient can call to inquire or [facility] will contact the patient when they are able to get to the referral - not sure how quickly they will get to her referral, she might want to call them.    Pt can call [facility] at [phone].

## 2020-12-10 NOTE — Patient Instructions (Signed)
Go to the lab on the way out.   If you have mychart we'll likely use that to update you.    If your facial pain gets worse then start augmentin.  If the abdominal pain is clearly worse then go to the ER.  Take care.  Glad to see you.

## 2020-12-12 DIAGNOSIS — K529 Noninfective gastroenteritis and colitis, unspecified: Secondary | ICD-10-CM | POA: Insufficient documentation

## 2020-12-12 NOTE — Telephone Encounter (Signed)
Called and advised patient on below. Patient stated she will call if they do not call her by Friday.

## 2020-12-12 NOTE — Assessment & Plan Note (Signed)
Discussed with patient about infectious versus ischemic versus inflammatory colitis.  She needs GI follow-up.  Recheck labs pending.  See notes on labs.  Refer to GI.  Fortunately she not passing blood in the meantime.  I suspect her left-sided discomfort is due to colitis.  I did cut MiraLAX back to every other day in the meantime.  Discussed gradual alcohol and smoking cessation/taper.  Routine cautions given to patient, especially if he has shortness of breath or more blood in stool.  Recheck LFTs due to recent mild transaminitis.

## 2020-12-12 NOTE — Assessment & Plan Note (Signed)
She could have had sinusitis.  Discussed options.  Nontoxic.  She is feeling better in the meantime.  Would hold prescription for Augmentin.  If progressive sinus pressure then start Augmentin.  Lungs are clear in the meantime.  Okay for outpatient follow-up.

## 2020-12-21 ENCOUNTER — Telehealth: Payer: Self-pay | Admitting: Internal Medicine

## 2020-12-21 DIAGNOSIS — G4733 Obstructive sleep apnea (adult) (pediatric): Secondary | ICD-10-CM

## 2020-12-22 NOTE — Telephone Encounter (Signed)
Patient is calling to get her sleep study results from 07/25.  CY please advise. Thanks

## 2020-12-23 NOTE — Telephone Encounter (Signed)
Her home sleep test showed severe obstructive sleep apnea, averaging 61 apneas/ hour with drops in blood oxygen level. CPAP is the right treatment for this  Order- new DME, new CPAP auto 5-20, mask of choice humidifier, supplies, AirView/ card  She will need ROV with me in 31-90 days after getting her machine

## 2020-12-24 NOTE — Telephone Encounter (Signed)
Called and spoke with pt letting her know the results of the HST and she verbalized understanding. Order has been placed for the cpap start. Nothing further needed.

## 2021-01-10 ENCOUNTER — Encounter: Payer: Self-pay | Admitting: Internal Medicine

## 2021-01-10 ENCOUNTER — Ambulatory Visit (INDEPENDENT_AMBULATORY_CARE_PROVIDER_SITE_OTHER): Payer: 59 | Admitting: Internal Medicine

## 2021-01-10 VITALS — BP 118/70 | HR 88 | Ht 67.0 in | Wt 219.5 lb

## 2021-01-10 DIAGNOSIS — R109 Unspecified abdominal pain: Secondary | ICD-10-CM | POA: Diagnosis not present

## 2021-01-10 DIAGNOSIS — K5909 Other constipation: Secondary | ICD-10-CM | POA: Diagnosis not present

## 2021-01-10 DIAGNOSIS — K921 Melena: Secondary | ICD-10-CM | POA: Diagnosis not present

## 2021-01-10 DIAGNOSIS — K559 Vascular disorder of intestine, unspecified: Secondary | ICD-10-CM | POA: Diagnosis not present

## 2021-01-10 MED ORDER — SUTAB 1479-225-188 MG PO TABS: ORAL_TABLET | ORAL | 0 refills | Status: DC

## 2021-01-10 NOTE — Patient Instructions (Signed)
You have been scheduled for a colonoscopy. Please follow written instructions given to you at your visit today.  Please pick up your prep supplies at the pharmacy within the next 1-3 days. If you use inhalers (even only as needed), please bring them with you on the day of your procedure.  If you are age 58 or older, your body mass index should be between 23-30. Your Body mass index is 34.38 kg/m. If this is out of the aforementioned range listed, please consider follow up with your Primary Care Provider.  If you are age 19 or younger, your body mass index should be between 19-25. Your Body mass index is 34.38 kg/m. If this is out of the aformentioned range listed, please consider follow up with your Primary Care Provider.   ________________________________________________________  The [facility] providers would like to encourage you to use [facility] to communicate with providers for non-urgent requests or questions.  Due to long hold times on the telephone, sending your provider a message by [facility] may be a faster and more efficient way to get a response.  Please allow 48 business hours for a response.  Please remember that this is for non-urgent requests.   Due to recent changes in healthcare laws, you may see the results of your imaging and laboratory studies on MyChart before your provider has had a chance to review them.  We understand that in some cases there may be results that are confusing or concerning to you. Not all laboratory results come back in the same time frame and the provider may be waiting for multiple results in order to interpret others.  Please give [place] 48 hours in order for your provider to thoroughly review all the results before contacting the office for clarification of your results.

## 2021-01-10 NOTE — Progress Notes (Signed)
Subjective:    Patient ID: Nichole Burke, female    DOB: 1961/02/18, 60 y.o.   MRN: [id number]  HPI Nichole Burke is a 60 year old female with a history of adenomatous colon polyps, diverticulosis, GERD, hypertension, hyperlipidemia, sleep apnea who is seen in consultation at the request of Dr. Damita Dunnings to evaluate left-sided abdominal pain, 3 to 4 days of hematochezia and abnormal CT scan of the colon.  She is here alone today.  She is known to me from her screening and surveillance colonoscopies given her polyp history as well as an evaluation of GERD 3 years ago.    Her last colonoscopy was performed on 04/06/2018.  This revealed 11 colon polyps which were removed from the ascending, transverse, descending and sigmoid colon.  Diverticulosis at the hepatic flexure and in the ascending colon and internal hemorrhoids.  These polyps were found to be predominantly adenomatous with one being hyperplastic.  1 year recall was recommended at that time.  This has not yet been done.  She reports that around 12/06/2020 she developed the urge to defecate and she had significant left-sided and left upper quadrant abdominal pain.  She was not able to have a bowel movement but then an hour or so later she started passing large volume maroon and red blood.  She was seen in the emergency department.  The abdominal pain persisted for a week or 2 and is also there some now but much less severe.  Her bleeding lasted 3 or 4 days and has resolved.  In the ER she had a CT scan which was abnormal showing wall thickening in the left colon with surrounding inflammatory change.  She was treated conservatively and MiraLAX was recommended.  She reports that she continues to have intermittent left upper and left-sided abdominal pain though this is much less severe.  She has remained constipated having a bowel movement every 2 to 3 days.  She is using MiraLAX and reports her stools are softer but not necessarily easier to pass.  She  does feel that her bowel movements are incomplete.  She has not had any fevers.  Again no bleeding in stools since 3 or 4 days after the episode described above.  Reflux has been controlled with her pantoprazole 40 mg daily.   Review of Systems As per HPI, otherwise negative  Current Medications, Allergies, Past Medical History, Past Surgical History, Family History and Social History were reviewed in [employer] record.    Objective:   Physical Exam BP 118/70   Pulse 88   Ht '5\' 7"'$  (1.702 m)   Wt 219 lb 8 oz (99.6 kg)   LMP 01/19/2013   BMI 34.38 kg/m  Gen: awake, alert, NAD HEENT: anicteric  CV: RRR, no mrg Pulm: CTA b/l Abd: soft, mild tenderness in the left upper and left middle abdomen without rebound or guarding, nondistended, obese, +BS throughout Ext: no c/c/e Neuro: nonfocal  CBC    Component Value Date/Time   WBC 9.0 12/10/2020 1306   RBC 4.57 12/10/2020 1306   HGB 13.8 12/10/2020 1306   HCT 41.1 12/10/2020 1306   PLT 138.0 (L) 12/10/2020 1306   MCV 89.9 12/10/2020 1306   MCH 30.1 12/06/2020 0905   MCHC 33.5 12/10/2020 1306   RDW 14.0 12/10/2020 1306   LYMPHSABS 2.7 12/10/2020 1306   MONOABS 0.7 12/10/2020 1306   EOSABS 0.1 12/10/2020 1306   BASOSABS 0.0 12/10/2020 1306  CLINICAL DATA:  Left lower quadrant pain  EXAM: CT ABDOMEN AND PELVIS WITHOUT CONTRAST   TECHNIQUE: Multidetector CT imaging of the abdomen and pelvis was performed following the standard protocol without IV contrast.   COMPARISON:  Multiple priors, most recent CT abdomen and pelvis dated November 27, 2017   FINDINGS: Lower chest: No acute abnormality.   Hepatobiliary: Hepatic steatosis. Normal gallbladder. No evidence of biliary ductal dilation.   Pancreas: Unremarkable. No pancreatic ductal dilatation or surrounding inflammatory changes.   Spleen: Normal in size without focal abnormality.   Adrenals/Urinary Tract: Status post left nephrectomy. Left  adrenal gland is not well visualized and may also be surgically absent. Normal appearing right adrenal gland and kidney. Normal appearance of the bladder.   Stomach/Bowel: Normal stomach wall thickening and inflammatory change of the left colon. No significant diverticular disease. Normal appearance of the small bowel.   Vascular/Lymphatic: Aortic atherosclerosis. No enlarged abdominal or pelvic lymph nodes.   Reproductive: Uterus and bilateral adnexa are unremarkable.   Other: Small fat containing umbilical hernia. Unchanged margins low-density lesion located anterior to the right psoas muscle, measures up to 1.9 cm and is best seen on series 2, image 57.   Musculoskeletal: No acute or significant osseous findings.   IMPRESSION: Wall thickening of the left colon with surrounding inflammatory change, findings can be seen in the setting of an infectious, inflammatory or ischemic colitis.   Hepatic steatosis.   Unchanged homogeneous low-density lesion anterior to the right psoas muscle, has been present dating back to 2016 prior exam.   Aortic Atherosclerosis (ICD10-I70.0).     Electronically Signed   By: Yetta Glassman MD   On: 12/06/2020 10:17     Assessment & Plan:  60 year old female with a history of adenomatous colon polyps, diverticulosis, GERD, hypertension, hyperlipidemia, sleep apnea who is seen in consultation at the request of Dr. Damita Dunnings to evaluate left-sided abdominal pain, 3 to 4 days of hematochezia and abnormal CT scan of the colon.    Left-sided abdominal pain/hematochezia/abnormal CT scan of the colon --presentation is most consistent with an episode of ischemic colitis.  This does not sound infectious nor like IBD.  It is likely it was exacerbated by constipation which we have discussed, see #2.  I recommended direct visualization with colonoscopy.  We discussed the risk, benefits and alternatives and she is agreeable and wishes to proceed.  Fortunately  her hematochezia has resolved and not recurred. --Colonoscopy in the [place]  2.  Chronic constipation --MiraLAX ineffective after adequate trial.  We will try Trulance 3 mg daily.  We should have samples available early next week and I would like her to come by and try this before we submit prescription --Trulance 3 mg daily; if ineffective or causing diarrhea we could consider Amitiza or Linzess  3.  History of multiple adenomatous colon polyps --overdue for colonoscopy for polyp surveillance.  We are proceeding with colonoscopy as with #1.

## 2021-01-18 ENCOUNTER — Other Ambulatory Visit: Payer: Self-pay

## 2021-01-18 ENCOUNTER — Encounter: Payer: Self-pay | Admitting: Internal Medicine

## 2021-01-18 ENCOUNTER — Ambulatory Visit (AMBULATORY_SURGERY_CENTER): Payer: 59 | Admitting: Internal Medicine

## 2021-01-18 VITALS — BP 129/53 | HR 59 | Temp 98.0°F | Resp 13 | Ht 67.0 in | Wt 219.0 lb

## 2021-01-18 DIAGNOSIS — R933 Abnormal findings on diagnostic imaging of other parts of digestive tract: Secondary | ICD-10-CM

## 2021-01-18 DIAGNOSIS — Z86010 Personal history of colonic polyps: Secondary | ICD-10-CM

## 2021-01-18 DIAGNOSIS — K922 Gastrointestinal hemorrhage, unspecified: Secondary | ICD-10-CM | POA: Diagnosis not present

## 2021-01-18 DIAGNOSIS — D122 Benign neoplasm of ascending colon: Secondary | ICD-10-CM

## 2021-01-18 DIAGNOSIS — K5730 Diverticulosis of large intestine without perforation or abscess without bleeding: Secondary | ICD-10-CM | POA: Diagnosis not present

## 2021-01-18 DIAGNOSIS — R1032 Left lower quadrant pain: Secondary | ICD-10-CM | POA: Diagnosis not present

## 2021-01-18 DIAGNOSIS — D123 Benign neoplasm of transverse colon: Secondary | ICD-10-CM

## 2021-01-18 DIAGNOSIS — K5909 Other constipation: Secondary | ICD-10-CM

## 2021-01-18 MED ORDER — SODIUM CHLORIDE 0.9 % IV SOLN
500.0000 mL | Freq: Once | INTRAVENOUS | Status: DC
Start: 2021-01-18 — End: 2021-01-18

## 2021-01-18 NOTE — Op Note (Signed)
[facility] Patient Name: Nichole Burke Procedure Date: 01/18/2021 9:49 AM MRN: [id number] Endoscopist: Jerene Bears , MD Age: 60 Referring MD:  Date of Birth: [birth year] Gender: Female Account #: [id number] Procedure:                Colonoscopy Indications:              Abdominal pain in the left upper quadrant,                            Hematochezia (now resolved), Abnormal CT of the GI                            tract suggesting left sided colitis, history of                            multiple adenomatous colon polyps - last                            colonoscopy 2019 Medicines:                Monitored Anesthesia Care Procedure:                Pre-Anesthesia Assessment:                           - Prior to the procedure, a History and Physical                            was performed, and patient medications and                            allergies were reviewed. The patient's tolerance of                            previous anesthesia was also reviewed. The risks                            and benefits of the procedure and the sedation                            options and risks were discussed with the patient.                            All questions were answered, and informed consent                            was obtained. Prior Anticoagulants: The patient has                            taken no previous anticoagulant or antiplatelet                            agents. ASA Grade Assessment: II - A patient with  mild systemic disease. After reviewing the risks                            and benefits, the patient was deemed in                            satisfactory condition to undergo the procedure.                           After obtaining informed consent, the colonoscope                            was passed under direct vision. Throughout the                            procedure, the patient's blood pressure, pulse, and                             oxygen saturations were monitored continuously. The                            CF [id number] was introduced through the anus                            and advanced to the cecum, identified by                            appendiceal orifice and ileocecal valve. The                            colonoscopy was performed without difficulty. The                            patient tolerated the procedure well. The quality                            of the bowel preparation was good. The ileocecal                            valve, appendiceal orifice, and rectum were                            photographed. Scope In: 9:54:08 AM Scope Out: 10:09:01 AM Scope Withdrawal Time: 0 hours 11 minutes 40 seconds  Total Procedure Duration: 0 hours 14 minutes 53 seconds  Findings:                 The digital rectal exam was normal.                           A 4 mm polyp was found in the ascending colon. The                            polyp was sessile. The polyp was removed with a  cold snare. Resection and retrieval were complete.                           Four sessile polyps were found in the transverse                            colon. The polyps were 3 to 6 mm in size. These                            polyps were removed with a cold snare. Resection                            and retrieval were complete.                           Multiple small-mouthed diverticula were found in                            the descending colon, hepatic flexure and ascending                            colon.                           The retroflexed view of the distal rectum and anal                            verge was normal and showed no anal or rectal                            abnormalities.                           The exam was otherwise without abnormality. Complications:            No immediate complications. Estimated Blood Loss:     Estimated blood loss was  minimal. Impression:               - One 4 mm polyp in the ascending colon, removed                            with a cold snare. Resected and retrieved.                           - Four 3 to 6 mm polyps in the transverse colon,                            removed with a cold snare. Resected and retrieved.                           - Diverticulosis in the descending colon, at the                            hepatic flexure and in the ascending colon.                           -  The distal rectum and anal verge are normal on                            retroflexion view.                           - The examination was otherwise normal. No evidence                            of colitis (presumed resolved ischemic colitis). Recommendation:           - Patient has a contact number available for                            emergencies. The signs and symptoms of potential                            delayed complications were discussed with the                            patient. Return to normal activities tomorrow.                            Written discharge instructions were provided to the                            patient.                           - Resume previous diet.                           - Continue present medications.                           - Await pathology results.                           - Repeat colonoscopy is recommended for                            surveillance. The colonoscopy date will be                            determined after pathology results from today's                            exam become available for review. Jerene Bears, MD 01/18/2021 10:14:56 AM This report has been signed electronically.

## 2021-01-18 NOTE — Patient Instructions (Signed)
Handouts provided on polyps and diverticulosis.  ? ?YOU HAD AN ENDOSCOPIC PROCEDURE TODAY AT THE [facility]:   Refer to the procedure report that was given to you for any specific questions about what was found during the examination.  If the procedure report does not answer your questions, please call your gastroenterologist to clarify.  If you requested that your care partner not be given the details of your procedure findings, then the procedure report has been included in a sealed envelope for you to review at your convenience later. ? ?YOU SHOULD EXPECT: Some feelings of bloating in the abdomen. Passage of more gas than usual.  Walking can help get rid of the air that was put into your GI tract during the procedure and reduce the bloating. If you had a lower endoscopy (such as a colonoscopy or flexible sigmoidoscopy) you may notice spotting of blood in your stool or on the toilet paper. If you underwent a bowel prep for your procedure, you may not have a normal bowel movement for a few days. ? ?Please Note:  You might notice some irritation and congestion in your nose or some drainage.  This is from the oxygen used during your procedure.  There is no need for concern and it should clear up in a day or so. ? ?SYMPTOMS TO REPORT IMMEDIATELY: ? ?Following lower endoscopy (colonoscopy or flexible sigmoidoscopy): ? Excessive amounts of blood in the stool ? Significant tenderness or worsening of abdominal pains ? Swelling of the abdomen that is new, acute ? Fever of 100?F or higher ? ?For urgent or emergent issues, a gastroenterologist can be reached at any hour by calling [phone]. ?Do not use MyChart messaging for urgent concerns.  ? ? ?DIET:  We do recommend a small meal at first, but then you may proceed to your regular diet.  Drink plenty of fluids but you should avoid alcoholic beverages for 24 hours. ? ?ACTIVITY:  You should plan to take it easy for the rest of today and you should NOT  DRIVE or use heavy machinery until tomorrow (because of the sedation medicines used during the test).   ? ?FOLLOW UP: ?Our staff will call the number listed on your records 48-72 hours following your procedure to check on you and address any questions or concerns that you may have regarding the information given to you following your procedure. If we do not reach you, we will leave a message.  We will attempt to reach you two times.  During this call, we will ask if you have developed any symptoms of COVID 19. If you develop any symptoms (ie: fever, flu-like symptoms, shortness of breath, cough etc.) before then, please call [phone].  If you test positive for Covid 19 in the 2 weeks post procedure, please call and report this information to us.   ? ?If any biopsies were taken you will be contacted by phone or by letter within the next 1-3 weeks.  Please call us at [phone] if you have not heard about the biopsies in 3 weeks.  ? ? ?SIGNATURES/CONFIDENTIALITY: ?You and/or your care partner have signed paperwork which will be entered into your electronic medical record.  These signatures attest to the fact that that the information above on your After Visit Summary has been reviewed and is understood.  Full responsibility of the confidentiality of this discharge information lies with you and/or your care-partner. ? ?

## 2021-01-18 NOTE — Progress Notes (Signed)
Vital signs checked by:CW ? ?The medical and surgical history was reviewed and verified with the patient. ? ?

## 2021-01-18 NOTE — Progress Notes (Signed)
Called to room to assist during endoscopic procedure.  Patient ID and intended procedure confirmed with present staff. Received instructions for my participation in the procedure from the performing physician.  

## 2021-01-18 NOTE — Progress Notes (Signed)
See my note dated 01/10/2021 for details No change in medical history since office visit 8 days ago No complaints today including chest pain or shortness of breath Proceed with colonoscopy to evaluate left-sided abdominal pain recent hematochezia and abnormal CT scan of the colon

## 2021-01-18 NOTE — Progress Notes (Signed)
Report to PACU, RN, vss, BBS= Clear.  

## 2021-01-18 NOTE — Progress Notes (Signed)
Vitals not coming over

## 2021-01-22 ENCOUNTER — Telehealth: Payer: Self-pay

## 2021-01-22 NOTE — Telephone Encounter (Signed)
  Follow up Call-  Call back number 01/18/2021  Post procedure Call Back phone  # [phone]  Permission to leave phone message Yes  Some recent data might be hidden     Patient questions:  Do you have a fever, pain , or abdominal swelling? No. Pain Score  0 *  Have you tolerated food without any problems? Yes.    Have you been able to return to your normal activities? Yes.    Do you have any questions about your discharge instructions: Diet   No. Medications  No. Follow up visit  No.  Do you have questions or concerns about your Care? No.  Actions: * If pain score is 4 or above: No action needed, pain <4.   Have you developed a fever since your procedure? no  2.   Have you had an respiratory symptoms (SOB or cough) since your procedure? no  3.   Have you tested positive for COVID 19 since your procedure no  4.   Have you had any family members/close contacts diagnosed with the COVID 19 since your procedure?  no   If yes to any of these questions please route to Joylene John, RN and Joella Prince, RN

## 2021-01-23 ENCOUNTER — Encounter: Payer: Self-pay | Admitting: Internal Medicine

## 2021-02-22 NOTE — Progress Notes (Signed)
10/24/20- 55 yoF Smoker (40 pkyrs)for sleep evaluation with concern of snoring, courtesy of Elsie Stain, MD. Medical problem list includes HTN, OSA, GERD/ Dysphagia, Eustachian Dysfunction, Tobacco Abuse, Hyperlipidemia, Hx Renal Cell Cancer,/  Left Nephrectomy,   HST 08/13/14- AHI 14/ hr, desat to 81.5% HST 02/14/18- AHI 41.6/ hr, desat to 86% Epworth score- 18 Body weight today-230 lbs Covid vax-none -----Ref. By Dr. Damita Dunnings for snoring. Tired all the time She remembers trying CPAP for awhile but couldn't keep mask seal. Gave up without seeking refit. Aware of head-bob and sometimes will cry out in dream, but no significant movement disorder or complex parasomnia. Works for a [employer] from [employer]. HS around 8-9PM. No sleep meds.  2-3 sodas or tea/ day, no coffee.  Denies ENT surgery, heart or lung disease or thyroid problem. 2 brothers on CPAP.   02/25/21- 60 yoF Smoker (40 pkyrs) followed for OSA, complicated by HTN, OSA, GERD/ Dysphagia, Eustachian Dysfunction, Tobacco Abuse, Hyperlipidemia, Hx Renal Cell Cancer,/  Left Nephrectomy,   HST 11/26/20- AHI 60.9/ hr, desaturation to 74%, body weight 230 lbs CPAP auto 5-20/ [facility]   ordered 12/24/20 Download-compliance 93%, AHI 0.5/ hr Body weight today- Covid vax-none Flu vax- Patient feels good overall feels like she may need more air with machine.  Download reviewed with her.  She would like to try a little higher pressure.  We are changing to 8-20. Emphasized smoking cessation and support available.  ROS-see HPI   + = positive Constitutional:    weight loss, night sweats, fevers, chills, +fatigue, lassitude. HEENT:    headaches, difficulty swallowing, tooth/dental problems, sore throat,       sneezing, itching, ear ache, nasal congestion, post nasal drip, snoring CV:    chest pain, orthopnea, PND, swelling in lower extremities, anasarca,        dizziness, palpitations Resp:   shortness of breath with exertion or at rest.                 productive cough,   non-productive cough, coughing up of blood.              change in color of mucus.  wheezing.   Skin:    rash or lesions. GI:  No-   heartburn, indigestion, abdominal pain, nausea, vomiting, diarrhea,                 change in bowel habits, loss of appetite GU: dysuria, change in color of urine, no urgency or frequency.   flank pain. MS:   joint pain, stiffness, decreased range of motion, back pain. Neuro-     nothing unusual Psych:  change in mood or affect.  depression or anxiety.   memory loss.   OBJ- Physical Exam General- Alert, Oriented, Affect-appropriate, Distress- none acute, +Obese Skin- rash-none, lesions- none, excoriation- none Lymphadenopathy- none Head- atraumatic            Eyes- Gross vision intact, PERRLA, conjunctivae and secretions clear            Ears- Hearing, canals-normal            Nose- Clear, no-Septal dev, mucus, polyps, erosion, perforation             Throat- Mallampati IV , mucosa clear , drainage- none, tonsils- atrophic, + teeth Neck- flexible , trachea midline, no stridor , thyroid nl, carotid no bruit Chest - symmetrical excursion , unlabored           Heart/CV- RRR , no  murmur , no gallop  , no rub, nl s1 s2                           - JVD- none , edema- none, stasis changes- none, varices- none           Lung- clear to P&A, wheeze- none, cough- none , dullness-none, rub- none           Chest wall-  Abd-  Br/ Gen/ Rectal- Not done, not indicated Extrem- cyanosis- none, clubbing, none, atrophy- none, strength- nl Neuro- +head-bob tremor

## 2021-02-25 ENCOUNTER — Ambulatory Visit (INDEPENDENT_AMBULATORY_CARE_PROVIDER_SITE_OTHER): Payer: 59 | Admitting: Internal Medicine

## 2021-02-25 ENCOUNTER — Encounter: Payer: Self-pay | Admitting: Internal Medicine

## 2021-02-25 ENCOUNTER — Other Ambulatory Visit: Payer: Self-pay

## 2021-02-25 VITALS — BP 120/70 | HR 71 | Temp 98.2°F | Ht 67.0 in | Wt 229.0 lb

## 2021-02-25 DIAGNOSIS — Z720 Tobacco use: Secondary | ICD-10-CM | POA: Diagnosis not present

## 2021-02-25 DIAGNOSIS — G4733 Obstructive sleep apnea (adult) (pediatric): Secondary | ICD-10-CM | POA: Diagnosis not present

## 2021-02-25 NOTE — Patient Instructions (Signed)
Order- DME [facility] Romans   please change autopap range to 8-20, continue mask of choice, humidifier, supplies, AirView/ card  Please call if we can help

## 2021-06-16 ENCOUNTER — Other Ambulatory Visit: Payer: Self-pay | Admitting: Family Medicine

## 2021-06-19 ENCOUNTER — Encounter: Payer: Self-pay | Admitting: Internal Medicine

## 2021-06-19 NOTE — Assessment & Plan Note (Signed)
Offered pharmacy telephone smoking cessation support program when she feels ready to try.

## 2021-06-19 NOTE — Assessment & Plan Note (Signed)
Benefits from CPAP with good compliance and control.  She would like to try a higher pressure and I discussed what that would mean with her AutoPap. Plan-change auto range to 8-20

## 2021-07-31 ENCOUNTER — Ambulatory Visit (INDEPENDENT_AMBULATORY_CARE_PROVIDER_SITE_OTHER): Payer: Self-pay | Admitting: Family Medicine

## 2021-07-31 ENCOUNTER — Other Ambulatory Visit: Payer: Self-pay

## 2021-07-31 ENCOUNTER — Ambulatory Visit (INDEPENDENT_AMBULATORY_CARE_PROVIDER_SITE_OTHER)
Admission: RE | Admit: 2021-07-31 | Discharge: 2021-07-31 | Disposition: A | Discharge status: Home / Self Care | Payer: Self-pay | Source: Ambulatory Visit | Attending: Family Medicine | Admitting: Family Medicine

## 2021-07-31 ENCOUNTER — Encounter: Payer: Self-pay | Admitting: Family Medicine

## 2021-07-31 VITALS — BP 130/84 | HR 80 | Temp 97.9°F | Ht 67.0 in | Wt 226.4 lb

## 2021-07-31 DIAGNOSIS — M546 Pain in thoracic spine: Secondary | ICD-10-CM

## 2021-07-31 DIAGNOSIS — M419 Scoliosis, unspecified: Secondary | ICD-10-CM | POA: Insufficient documentation

## 2021-07-31 DIAGNOSIS — Z720 Tobacco use: Secondary | ICD-10-CM

## 2021-07-31 DIAGNOSIS — Z905 Acquired absence of kidney: Secondary | ICD-10-CM

## 2021-07-31 DIAGNOSIS — Z85528 Personal history of other malignant neoplasm of kidney: Secondary | ICD-10-CM

## 2021-07-31 LAB — CBC WITH DIFFERENTIAL/PLATELET
Basophils Absolute: 0.1 10*3/uL (ref 0.0–0.1)
Basophils Relative: 0.6 % (ref 0.0–3.0)
Eosinophils Absolute: 0.2 10*3/uL (ref 0.0–0.7)
Eosinophils Relative: 1.4 % (ref 0.0–5.0)
HCT: 42.7 % (ref 36.0–46.0)
Hemoglobin: 14.2 g/dL (ref 12.0–15.0)
Lymphocytes Relative: 22.6 % (ref 12.0–46.0)
Lymphs Abs: 2.6 10*3/uL (ref 0.7–4.0)
MCHC: 33.1 g/dL (ref 30.0–36.0)
MCV: 89.6 fl (ref 78.0–100.0)
Monocytes Absolute: 0.6 10*3/uL (ref 0.1–1.0)
Monocytes Relative: 5.4 % (ref 3.0–12.0)
Neutro Abs: 8.2 10*3/uL — ABNORMAL HIGH (ref 1.4–7.7)
Neutrophils Relative %: 70 % (ref 43.0–77.0)
Platelets: 150 10*3/uL (ref 150.0–400.0)
RBC: 4.77 Mil/uL (ref 3.87–5.11)
RDW: 14.5 % (ref 11.5–15.5)
WBC: 11.7 10*3/uL — ABNORMAL HIGH (ref 4.0–10.5)

## 2021-07-31 LAB — POC URINALSYSI DIPSTICK (AUTOMATED)
Bilirubin, UA: NEGATIVE
Blood, UA: NEGATIVE
Glucose, UA: NEGATIVE
Ketones, UA: NEGATIVE
Leukocytes, UA: NEGATIVE
Nitrite, UA: NEGATIVE
Protein, UA: POSITIVE — AB
Spec Grav, UA: 1.02 (ref 1.010–1.025)
Urobilinogen, UA: 0.2 E.U./dL
pH, UA: 6 (ref 5.0–8.0)

## 2021-07-31 LAB — COMPREHENSIVE METABOLIC PANEL
ALT: 30 U/L (ref 0–35)
AST: 31 U/L (ref 0–37)
Albumin: 4.2 g/dL (ref 3.5–5.2)
Alkaline Phosphatase: 87 U/L (ref 39–117)
BUN: 17 mg/dL (ref 6–23)
CO2: 29 mEq/L (ref 19–32)
Calcium: 9.6 mg/dL (ref 8.4–10.5)
Chloride: 103 mEq/L (ref 96–112)
Creatinine, Ser: 1.05 mg/dL (ref 0.40–1.20)
GFR: 58.4 mL/min — ABNORMAL LOW (ref >60.00)
Glucose, Bld: 108 mg/dL — ABNORMAL HIGH (ref 70–99)
Potassium: 4.2 mEq/L (ref 3.5–5.1)
Sodium: 141 mEq/L (ref 135–145)
Total Bilirubin: 0.4 mg/dL (ref 0.2–1.2)
Total Protein: 6.7 g/dL (ref 6.0–8.3)

## 2021-07-31 MED ORDER — METHOCARBAMOL 500 MG PO TABS
500.0000 mg | ORAL_TABLET | Freq: Three times a day (TID) | ORAL | 0 refills | Status: DC | PRN
Start: 2021-07-31 — End: 2022-06-09

## 2021-07-31 NOTE — Assessment & Plan Note (Signed)
Significant dextroscoliosis noted of thoracic spine. Update CXR.  ?

## 2021-07-31 NOTE — Patient Instructions (Addendum)
I think this is muscular pain. May use salon pas pads and muscle relaxant I've sent to your pharmacy (robaxin). Don't take and drive as it can make you sleepy.  ?May also continue heating pad use.  ?You do have some significant scoliosis of the thoracic spine.  ?I do want to check chest xray, labs and urinalysis today.  ?We will be in touch with results. ?Let us know if not improving with above for further evaluation. ?

## 2021-07-31 NOTE — Progress Notes (Signed)
? ? Patient ID: [name], female    DOB: December 25, 1960, 61 y.o.   MRN: [id number] ? ?This visit was conducted in person. ? ?BP 130/84   Pulse 80   Temp 97.9 ?F (36.6 ?C) (Temporal)   Ht '5\' 7"'$  (1.702 m)   Wt 226 lb 6 oz (102.7 kg)   LMP 01/19/2013   SpO2 97%   BMI 35.46 kg/m?   ? ?CC: L mid back pain "feels like knife sticking in my back"  ?Subjective:  ? ?HPI: ?PAYGE EPPES is a 61 y.o. female presenting on 07/31/2021 for Back Pain (C/o L mid back pain under shoulder blade into the ribs.  Started 3-4 days ago.  Tried ice/heat therapy, not helpful. Pt accompanied by husband, Timmothy Sours. ) ? ? ?Patient of Dr. Josefine Class, new to me, presents with 3-4 d h/o L sharp stabbing mid back pain under the shoulder blade with radiation into the ribs. Worse yesterday - stayed in recliner due to pain. Today it's feeling some better. No inciting trauma/injury or falls.  ? ?No blistering rash. ?No h/o shingles, has not had shingrix vaccine.  ?No tingling/numbness to area.  ? ?No fevers/chills, nausae/vomiting, bowel changes dyspnea, cough or chest pain.  ?Urinating normally. No blood in urine or stool. No weight changes.  ? ?So far has tried ice/heat therapy without significant benefit. No meds tried yet.  ?Avoids NSAIDs in h/o L nephrectomy for RCC (2017). Last saw Dr Tresa Moore about 3 yrs ago.  ?Continued 1ppd smoker.  ?   ? ?Relevant past medical, surgical, family and social history reviewed and updated as indicated. Interim medical history since our last visit reviewed. ?Allergies and medications reviewed and updated. ?Outpatient Medications Prior to Visit  ?Medication Sig Dispense Refill  ? aspirin EC 81 MG tablet Take 81 mg by mouth daily.    ? lisinopril (ZESTRIL) 20 MG tablet TAKE 1 TABLET BY MOUTH EVERY DAY 90 tablet 3  ? metoprolol succinate (TOPROL-XL) 25 MG 24 hr tablet TAKE 1 TABLET (25 MG TOTAL) BY MOUTH DAILY. 90 tablet 3  ? Multiple Vitamin (MULTIVITAMIN) tablet Take 1 tablet by mouth daily.    ? pantoprazole  (PROTONIX) 40 MG tablet TAKE 1 TABLET BY MOUTH EVERY DAY 90 tablet 3  ? sertraline (ZOLOFT) 100 MG tablet TAKE 1/2 TABLET BY MOUTH DAILY 90 tablet 1  ? ?No facility-administered medications prior to visit.  ?  ? ?Per HPI unless specifically indicated in ROS section below ?Review of Systems ? ?Objective:  ?BP 130/84   Pulse 80   Temp 97.9 ?F (36.6 ?C) (Temporal)   Ht '5\' 7"'$  (1.702 m)   Wt 226 lb 6 oz (102.7 kg)   LMP 01/19/2013   SpO2 97%   BMI 35.46 kg/m?   ?Wt Readings from Last 3 Encounters:  ?07/31/21 226 lb 6 oz (102.7 kg)  ?02/25/21 229 lb (103.9 kg)  ?01/18/21 219 lb (99.3 kg)  ?  ?  ?Physical Exam ?Vitals and nursing note reviewed.  ?Constitutional:   ?   Appearance: Normal appearance. She is not ill-appearing.  ?HENT:  ?   Mouth/Throat:  ?   Mouth: Mucous membranes are moist.  ?   Pharynx: Oropharynx is clear. No oropharyngeal exudate or posterior oropharyngeal erythema.  ?Eyes:  ?   Extraocular Movements: Extraocular movements intact.  ?   Pupils: Pupils are equal, round, and reactive to light.  ?Cardiovascular:  ?   Rate and Rhythm: Normal rate and regular rhythm.  ?  Pulses: Normal pulses.  ?   Heart sounds: Normal heart sounds. No murmur heard. ?Pulmonary:  ?   Effort: Pulmonary effort is normal. No respiratory distress.  ?   Breath sounds: Normal breath sounds. No wheezing, rhonchi or rales.  ?Abdominal:  ?   General: Bowel sounds are normal. There is no distension.  ?   Palpations: Abdomen is soft. There is no mass.  ?   Tenderness: There is generalized abdominal tenderness (mild-mod). There is no right CVA tenderness, left CVA tenderness, guarding or rebound. Negative signs include Murphy's sign.  ?   Hernia: No hernia is present.  ?Musculoskeletal:  ?   Right lower leg: No edema.  ?   Left lower leg: No edema.  ?   Comments:  ?Significant thoracic dextroscoliosis present ?No midline thoracic spine tenderness ?No significant paraspinous mm discomfort   ?Skin: ?   General: Skin is warm and dry.   ?   Findings: No rash.  ?   Comments: No skin changes or vesicular rash appreciated  ?Neurological:  ?   Mental Status: She is alert.  ?Psychiatric:     ?   Mood and Affect: Mood normal.     ?   Behavior: Behavior normal.  ? ?   ?Results for orders placed or performed in visit on 07/31/21  ?POCT Urinalysis Dipstick (Automated)  ?Result Value Ref Range  ? Color, UA yellow   ? Clarity, UA clear   ? Glucose, UA Negative Negative  ? Bilirubin, UA negative   ? Ketones, UA negative   ? Spec Grav, UA 1.020 1.010 - 1.025  ? Blood, UA negative   ? pH, UA 6.0 5.0 - 8.0  ? Protein, UA Positive (A) Negative  ? Urobilinogen, UA 0.2 0.2 or 1.0 E.U./dL  ? Nitrite, UA negative   ? Leukocytes, UA Negative Negative  ? ? ?Assessment & Plan:  ?This visit occurred during the SARS-CoV-2 public health emergency.  Safety protocols were in place, including screening questions prior to the visit, additional usage of staff PPE, and extensive cleaning of exam room while observing appropriate contact time as indicated for disinfecting solutions.  ? ?Problem List Items Addressed This Visit   ? ? Tobacco abuse  ? Back pain - Primary  ?  Acute left sided thoracic back pain, reproducible on exam today suspect MSK cause.  ?rec heating pad, salon pas pad, robaxin muscle relaxant.  ?In [place] hx s/p nephrectomy, check CXR, UA, labs.  ?No vesicular rash to suspect shingles.  ?Update if not improving with treatment to consider further imaging evaluation.  ?Pt agrees with plan.  ?  ?  ? Relevant Medications  ? methocarbamol (ROBAXIN) 500 MG tablet  ? Other Relevant Orders  ? DG Chest 2 View  ? CBC with Differential/Platelet  ? Comprehensive metabolic panel  ? H/O renal cell cancer  ? Relevant Orders  ? POCT Urinalysis Dipstick (Automated) (Completed)  ? CBC with Differential/Platelet  ? Comprehensive metabolic panel  ? Scoliosis  ?  Significant dextroscoliosis noted of thoracic spine. Update CXR.  ?  ?  ? ?Other Visit Diagnoses   ? ? S/p nephrectomy      ?  Relevant Orders  ? POCT Urinalysis Dipstick (Automated) (Completed)  ? ?  ?  ? ?Meds ordered this encounter  ?Medications  ? methocarbamol (ROBAXIN) 500 MG tablet  ?  Sig: Take 1 tablet (500 mg total) by mouth 3 (three) times daily as needed for muscle spasms.  ?  Dispense:  30 tablet  ?  Refill:  0  ? ?Orders Placed This Encounter  ?Procedures  ? DG Chest 2 View  ?  Standing Status:   Future  ?  Number of Occurrences:   1  ?  Standing Expiration Date:   08/01/2022  ?  Order Specific Question:   Reason for Exam (SYMPTOM  OR DIAGNOSIS REQUIRED)  ?  Answer:   left upper back pain  ?  Order Specific Question:   Is patient pregnant?  ?  Answer:   No  ?  Order Specific Question:   Preferred imaging location?  ?  Answer:   [facility]  ? CBC with Differential/Platelet  ? Comprehensive metabolic panel  ? POCT Urinalysis Dipstick (Automated)  ? ? ? ?Patient Instructions  ?I think this is muscular pain. May use salon pas pads and muscle relaxant I've sent to your pharmacy (robaxin). Don't take and drive as it can make you sleepy.  ?May also continue heating pad use.  ?You do have some significant scoliosis of the thoracic spine.  ?I do want to check chest xray, labs and urinalysis today.  ?We will be in touch with results. ?Let us know if not improving with above for further evaluation. ? ?Follow up plan: ?Return if symptoms worsen or fail to improve. ? ?Ria Bush, MD   ?

## 2021-07-31 NOTE — Assessment & Plan Note (Signed)
Acute left sided thoracic back pain, reproducible on exam today suspect MSK cause.  ?rec heating pad, salon pas pad, robaxin muscle relaxant.  ?In [place] hx s/p nephrectomy, check CXR, UA, labs.  ?No vesicular rash to suspect shingles.  ?Update if not improving with treatment to consider further imaging evaluation.  ?Pt agrees with plan.  ?

## 2021-08-05 ENCOUNTER — Other Ambulatory Visit: Payer: Self-pay | Admitting: Family Medicine

## 2021-08-05 MED ORDER — TRAMADOL HCL 50 MG PO TABS: 50.0000 mg | ORAL_TABLET | Freq: Three times a day (TID) | ORAL | 0 refills | Status: AC | PRN

## 2021-08-26 ENCOUNTER — Ambulatory Visit: Payer: 59 | Admitting: Internal Medicine

## 2021-12-08 IMAGING — DX DG ABDOMEN 1V
2 series · 2 of 2 positions shown · non-contrast
Comparison: 11/27/2017

CLINICAL DATA: Left-sided flank pain, history of prior left
nephrectomy

EXAM:
ABDOMEN - 1 VIEW

[abdomen kub (1 of 2)]
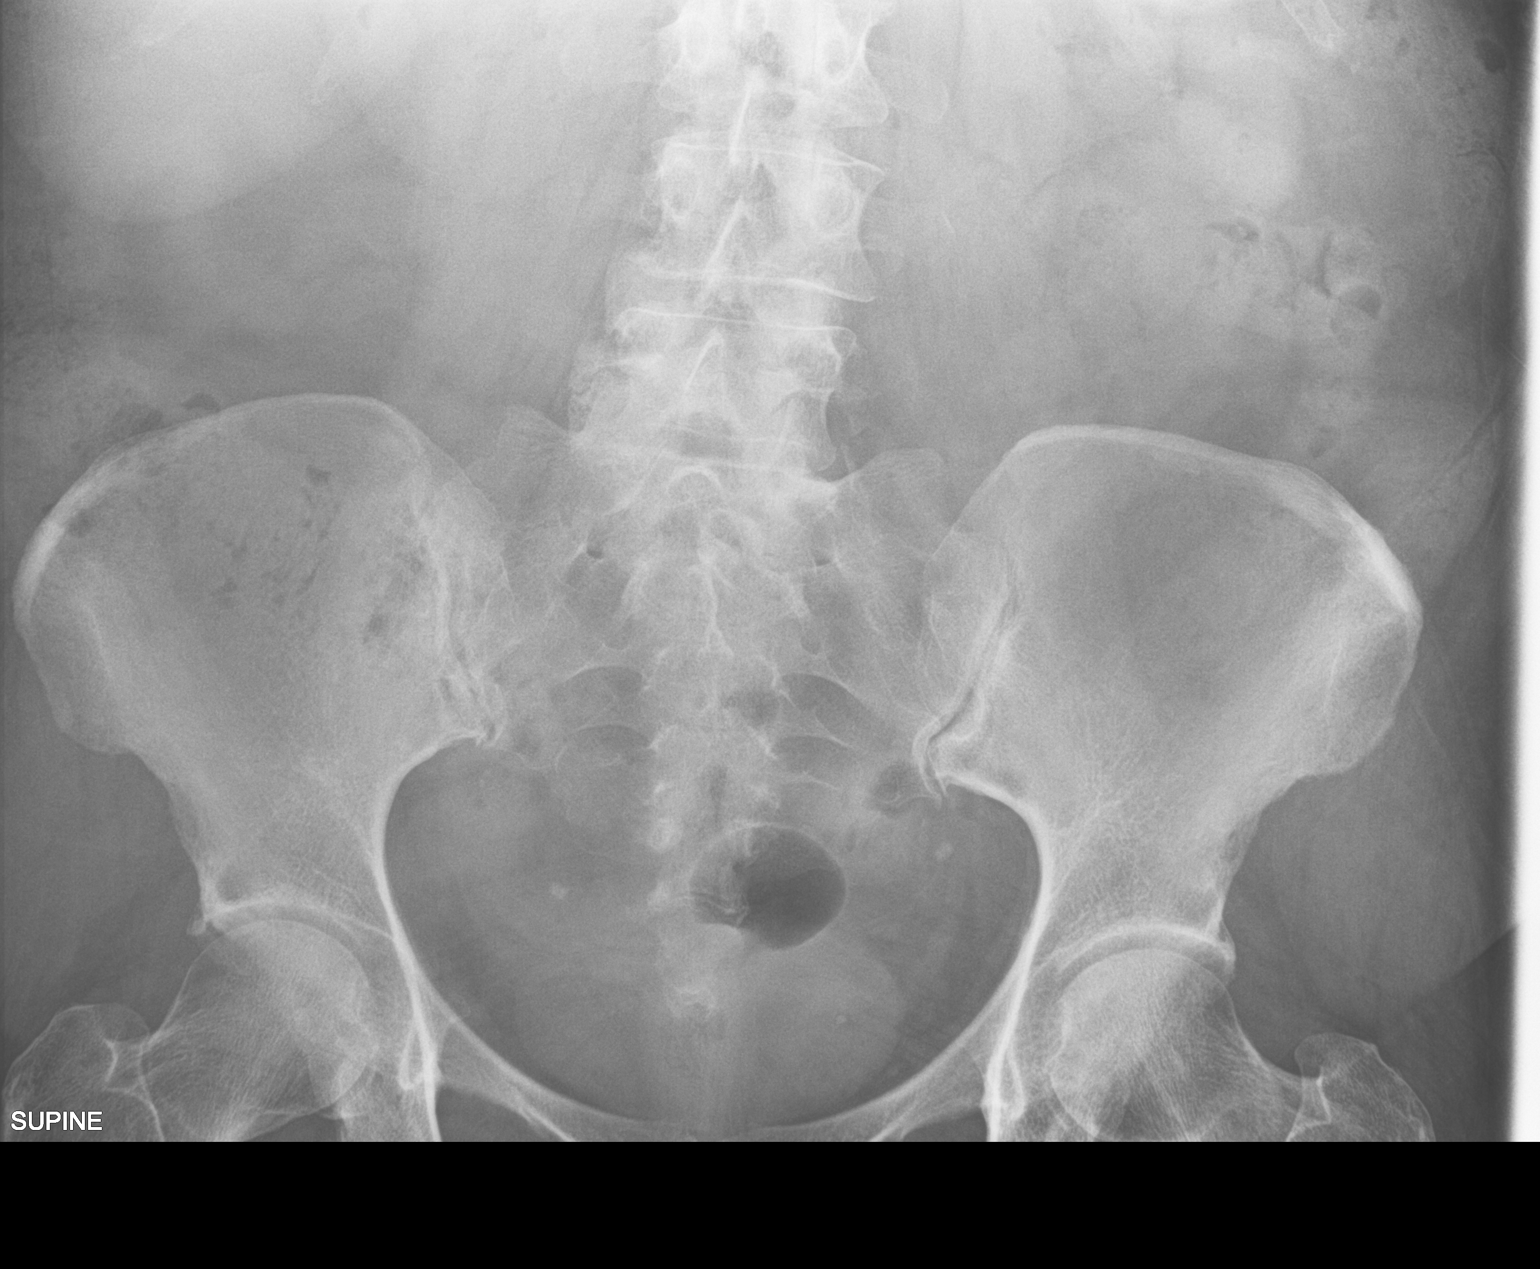

[abdomen kub (2 of 2)]
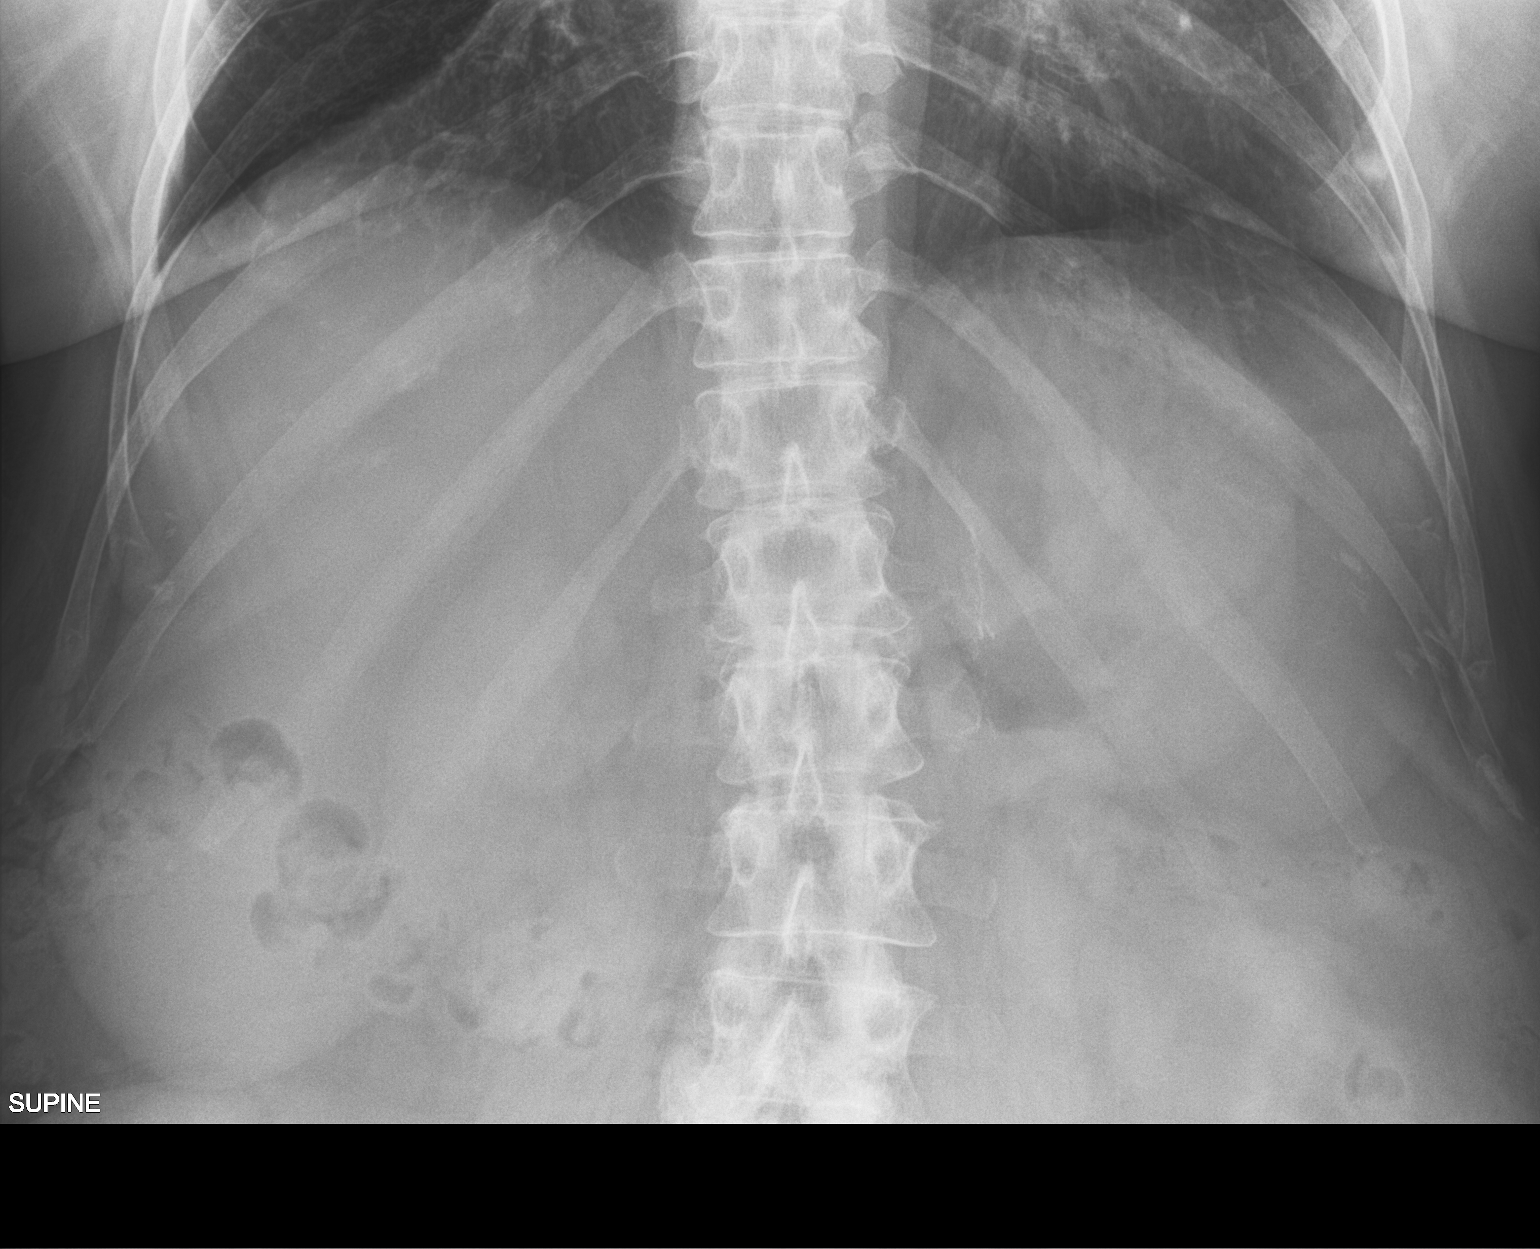

[2 of 2 positions shown; findings below may reference images not displayed]

FINDINGS: Scattered large and small bowel gas is noted. No obstructive changes
are noted. No free air is seen. Right kidney is within normal
limits. No bony abnormality is seen.
IMPRESSION: No acute abnormality noted.

## 2022-04-02 ENCOUNTER — Telehealth: Payer: Self-pay | Admitting: Family Medicine

## 2022-04-02 NOTE — Telephone Encounter (Signed)
Noted. Thanks.

## 2022-04-02 NOTE — Telephone Encounter (Signed)
Patient called in stating that she hs been experiencing dizziness and light headedness for over a week now. She said that when she bends down she gets really dizzy and feel like she's going to pass out. I sent her to access nurse to be triaged.

## 2022-04-02 NOTE — Telephone Encounter (Addendum)
Per appt notes pt already has appt scheduled with Dr Damita Dunnings on 04/03/22 at 3 PM. Sending note to Dr Damita Dunnings who is out of office, Dr Darnell Level who is in office and [place] pool.   [facility] - Client TELEPHONE ADVICE RECORD [facility] Patient Name: Nichole Burke Gender: Female DOB: Jun 23, 1960 Age: 60 Y 3 M 27 D Return Phone Number: [phone] (Primary) Address: City/ State/ Zip: [place] [address] Client [facility] - Client Client Site [place] - Day Provider Renford Dills - MD Contact Type Call Who Is Calling Patient / Member / Family / Caregiver Call Type Triage / Clinical Relationship To Patient Self Return Phone Number [phone] (Primary) Chief Complaint Dizziness Reason for Call Symptomatic / Request for Health Information Initial Comment Caller was transferred from the office, patient has been experiencing dizziness for a week now. Translation No Nurse Assessment Nurse: D'Heur Lucia Gaskins, RN, Adrienne Date/Time (Eastern Time): 04/02/2022 8:38:23 AM Confirm and document reason for call. If symptomatic, describe symptoms. ---Caller was transferred from the office, patient has been experiencing dizziness for a week now; she notices it when she bends over or stands. No other symptoms. Does the patient have any new or worsening symptoms? ---Yes Will a triage be completed? ---Yes Related visit to physician within the last 2 weeks? ---No Does the PT have any chronic conditions? (i.e. diabetes, asthma, this includes High risk factors for pregnancy, etc.) ---Yes List chronic conditions. ---HTN, one kidney Is this a behavioral health or substance abuse call? ---No Guidelines Guideline Title Affirmed Question Affirmed Notes Nurse Date/Time (Eastern Time) Dizziness - Lightheadedness [1] MODERATE dizziness (e.g., interferes with normal activities) AND [2] has NOT been evaluated by doctor (or  NP/PA) for this (Exception: Dizziness caused by heat exposure, sudden [place], RN, [place] 04/02/2022 8:39:52 AM PLEASE NOTE: All timestamps contained within this report are represented as [place] Standard Time. CONFIDENTIALTY NOTICE: This fax transmission is intended only for the addressee. It contains information that is legally privileged, confidential or otherwise protected from use or disclosure. If you are not the intended recipient, you are strictly prohibited from reviewing, disclosing, copying using or disseminating any of this information or taking any action in reliance on or regarding this information. If you have received this fax in error, please notify us immediately by telephone so that we can arrange for its return to [place]. Phone: [phone], Toll-Free: [phone], Fax: [phone] Page: 2 of 2 Call Id: [id number] Guidelines Guideline Title Affirmed Question Affirmed Notes Nurse Date/Time Eilene Ghazi Time) standing, or poor fluid intake.) Disp. Time Eilene Ghazi Time) Disposition Final User 04/02/2022 8:43:56 AM See PCP within 24 Hours Yes D'Heur Lucia Gaskins, RN, Adrienne Final Disposition 04/02/2022 8:43:56 AM See PCP within 24 Hours Yes D'Heur Lucia Gaskins, RN, Vincente Liberty Caller Disagree/Comply Comply Caller Understands Yes PreDisposition Call Doctor Care Advice Given Per Guideline SEE PCP WITHIN 24 HOURS: * IF OFFICE WILL BE OPEN: You need to be examined within the next 24 hours. Call your doctor (or NP/PA) when the office opens and make an appointment. DRINK FLUIDS: * Drink several glasses of fruit juice, other clear fluids or water. LIE DOWN AND REST: * Lie down with feet elevated for 1 hour. CALL BACK IF: * Passes out (faints) * You become worse CARE ADVICE given per Dizziness (Adult) guideline. Comments User: Vincente Liberty, D'Heur Lucia Gaskins, RN Date/Time Eilene Ghazi Time): 04/02/2022 8:41:31 AM Caller has anxiety. Referrals REFERRED TO PCP OFFIC

## 2022-04-02 NOTE — Telephone Encounter (Signed)
Noted. Agree with this. Thanks.

## 2022-04-03 ENCOUNTER — Ambulatory Visit (INDEPENDENT_AMBULATORY_CARE_PROVIDER_SITE_OTHER): Payer: Commercial Managed Care - HMO | Admitting: Family Medicine

## 2022-04-03 ENCOUNTER — Encounter: Payer: Self-pay | Admitting: Family Medicine

## 2022-04-03 VITALS — BP 120/78 | HR 68 | Temp 97.6°F | Ht 67.0 in | Wt 230.0 lb

## 2022-04-03 DIAGNOSIS — R2689 Other abnormalities of gait and mobility: Secondary | ICD-10-CM

## 2022-04-03 MED ORDER — MECLIZINE HCL 25 MG PO TABS: 12.5000 mg | ORAL_TABLET | Freq: Three times a day (TID) | ORAL | 0 refills | Status: DC | PRN

## 2022-04-03 NOTE — Patient Instructions (Addendum)
Possible BPV.  Try the beside exercise and use meclizine if needed. Sedation caution.  Let me know if this isn't helping.  Take care.  Glad to see you.

## 2022-04-03 NOTE — Progress Notes (Signed)
Episodically lightheaded.  Worse leaning over.  Can feel off balance for a few seconds, "like I need to hang on to something."  Sx when arising from stooping down.  Sx are episodic, over the last week.  No syncope.    No FCNAVD.  No rash.  No change in handwriting.  Not dropping objects.  Still working. Fatigue noted.    Meds, vitals, and allergies reviewed.   ROS: Per HPI unless specifically indicated in ROS section   GEN: nad, alert and oriented HEENT: ncat, TM wnl B NECK: supple w/o LA CV: rrr.  PULM: ctab, no inc wob ABD: soft, +bs EXT: no edema SKIN: no acute rash Dix-Hallpike negative.  She felt minimally lightheaded upon standing quickly but this is not the symptom that brought her in for the visit.  Head tremor at baseline. CN 2-12 wnl B, S/S/DTR wnl B

## 2022-04-06 DIAGNOSIS — R2689 Other abnormalities of gait and mobility: Secondary | ICD-10-CM | POA: Insufficient documentation

## 2022-04-06 NOTE — Assessment & Plan Note (Signed)
Possible BPV.  Anatomy discussed with patient. I asked her to try the routine beside exercise and use meclizine if needed. Sedation caution.  Let me know if this isn't helping.  She agrees to plan.  It does not appear that orthostasis is the contributing issue and discussed adequate fluid intake.

## 2022-05-30 ENCOUNTER — Telehealth: Payer: Self-pay | Admitting: Family Medicine

## 2022-05-30 ENCOUNTER — Ambulatory Visit
Admission: RE | Admit: 2022-05-30 | Discharge: 2022-05-30 | Disposition: A | Discharge status: Home / Self Care | Payer: Commercial Managed Care - HMO | Source: Ambulatory Visit | Attending: Internal Medicine | Admitting: Internal Medicine

## 2022-05-30 ENCOUNTER — Ambulatory Visit (INDEPENDENT_AMBULATORY_CARE_PROVIDER_SITE_OTHER): Payer: Commercial Managed Care - HMO

## 2022-05-30 VITALS — BP 142/98 | HR 83 | Temp 99.7°F | Resp 18

## 2022-05-30 DIAGNOSIS — R053 Chronic cough: Secondary | ICD-10-CM | POA: Diagnosis not present

## 2022-05-30 DIAGNOSIS — R059 Cough, unspecified: Secondary | ICD-10-CM

## 2022-05-30 DIAGNOSIS — M79675 Pain in left toe(s): Secondary | ICD-10-CM | POA: Diagnosis not present

## 2022-05-30 MED ORDER — PREDNISONE 20 MG PO TABS: 40.0000 mg | ORAL_TABLET | Freq: Every day | ORAL | 0 refills | Status: AC

## 2022-05-30 MED ORDER — BENZONATATE 100 MG PO CAPS: 100.0000 mg | ORAL_CAPSULE | Freq: Three times a day (TID) | ORAL | 0 refills | Status: DC | PRN

## 2022-05-30 NOTE — ED Provider Notes (Signed)
[facility]    CSN: [id number] Arrival date & time: 05/30/22  1523      History   Chief Complaint Chief Complaint  Patient presents with   Foot Pain    I think I have the gout - Entered by patient    HPI Nichole Burke is a 62 y.o. female.   Patient presents with 2 different chief complaints today.  Patient reports persistent cough since 04/29/2023.  Patient reports intermittent nasal congestion with this.  Cough is mostly dry.  Denies any associated fever.  Denies history of asthma or COPD but patient does smoke 1 pack of cigarettes per day.  Denies chest pain or shortness of breath.  Patient also reports left great toe pain that started yesterday.  Denies history of injury or any history of chronic toe pain.  Has taken Tylenol with minimal improvement.  Denies numbness or tingling.  Is able to bear weight.  Reports eating a lot of red meat and drinking some alcohol recently.   Foot Pain    Past Medical History:  Diagnosis Date   Anxiety    Aortic atherosclerosis (HCC)    Diverticulosis    Elevated LFTs    GERD (gastroesophageal reflux disease)    Hepatic steatosis    History of hiatal hernia    HTN (hypertension)    Internal hemorrhoids    Plantar fasciitis    Right   Pure hypercholesterolemia    Sleep apnea    in home sleep study; OSA; pt does not have CPAP   Tobacco abuse    Tremor    Tubular adenoma of colon    Wears glasses     Patient Active Problem List   Diagnosis Date Noted   Balance problem 04/06/2022   Scoliosis 07/31/2021   Colitis 12/12/2020   Snoring 10/14/2020   Paresthesia 05/22/2019   Advance care planning 12/03/2017   Tremor 12/03/2017   GERD (gastroesophageal reflux disease) 12/03/2017   Neck pain 01/29/2016   H/O renal cell cancer 08/29/2015   Chronic low back pain 11/21/2014   Dysphagia 10/02/2014   Hyperglycemia 09/20/2014   OSA (obstructive sleep apnea) 05/17/2014   Skin lesion 05/26/2013   Back pain 05/26/2013    Cough 03/09/2012   Alcohol use 12/07/2011   Routine general medical examination at a health care facility 12/07/2011   Lipoma 12/07/2011   Chest pain 10/11/2010   Aorta disorder ([place]) 10/10/2010   Knee pain 10/10/2010   Fatty liver 10/10/2010   PLANTAR FASCIITIS, RIGHT 06/24/2007   HYPERCHOLESTEROLEMIA 06/18/2007   Anxiety state 03/08/2007   ESSENTIAL HYPERTENSION, BENIGN 03/08/2007   Tobacco abuse 09/04/1978    Past Surgical History:  Procedure Laterality Date   LIPOMA EXCISION N/A 10/31/2015   Procedure: OPEN EXCISION LIPOMA;  Surgeon: Alexis Frock, MD;  Location: [facility];  Service: Urology;  Laterality: N/A;   PELVIC LYMPH NODE DISSECTION Bilateral 10/31/2015   Procedure: PELVIC LYMPH NODE DISSECTION;  Surgeon: Alexis Frock, MD;  Location: [facility];  Service: Urology;  Laterality: Bilateral;   ROBOT ASSISTED LAPAROSCOPIC NEPHRECTOMY Left 10/31/2015   Procedure: XI ROBOTIC ASSISTED LAPAROSCOPIC NEPHRECTOMY;  Surgeon: Alexis Frock, MD;  Location: [facility];  Service: Urology;  Laterality: Left;   TUBAL LIGATION  1995   after pregnancy with blighted ovum    OB History   No obstetric history on file.      Home Medications    Prior to Admission medications   Medication Sig Start Date End Date Taking? Authorizing  Provider  benzonatate (TESSALON) 100 MG capsule Take 1 capsule (100 mg total) by mouth every 8 (eight) hours as needed for cough. 05/30/22  Yes Otto Felkins, Hildred Alamin E, FNP  predniSONE (DELTASONE) 20 MG tablet Take 2 tablets (40 mg total) by mouth daily for 5 days. 05/30/22 06/04/22 Yes Teodora Medici, FNP  aspirin EC 81 MG tablet Take 81 mg by mouth daily.    [provider]  lisinopril (ZESTRIL) 20 MG tablet TAKE 1 TABLET BY MOUTH EVERY DAY 06/17/21   Tonia Ghent, MD  meclizine (ANTIVERT) 25 MG tablet Take 0.5-1 tablets (12.5-25 mg total) by mouth 3 (three) times daily as needed for dizziness. 04/03/22   Tonia Ghent, MD  methocarbamol (ROBAXIN) 500 MG tablet  Take 1 tablet (500 mg total) by mouth 3 (three) times daily as needed for muscle spasms. 07/31/21   Ria Bush, MD  metoprolol succinate (TOPROL-XL) 25 MG 24 hr tablet TAKE 1 TABLET (25 MG TOTAL) BY MOUTH DAILY. 06/17/21   Tonia Ghent, MD  Multiple Vitamin (MULTIVITAMIN) tablet Take 1 tablet by mouth daily.    [provider]  pantoprazole (PROTONIX) 40 MG tablet TAKE 1 TABLET BY MOUTH EVERY DAY 06/17/21   Tonia Ghent, MD  sertraline (ZOLOFT) 100 MG tablet TAKE 1/2 TABLET BY MOUTH DAILY 06/17/21   Tonia Ghent, MD    Family History Family History  Problem Relation Age of Onset   Heart attack Father 31   Heart disease Father    Coronary artery disease Father    Heart attack Mother    Coronary artery disease Mother    Diabetes Mother    Heart disease Mother    Diabetes Brother    Kidney cancer Brother    Breast cancer Neg Hx    Colon cancer Neg Hx    Colon polyps Neg Hx    Rectal cancer Neg Hx    Stomach cancer Neg Hx    Esophageal cancer Neg Hx     Social History Social History   Tobacco Use   Smoking status: Every Day    Packs/day: 1.00    Years: 40.00    Total pack years: 40.00    Types: Cigarettes   Smokeless tobacco: Never   Tobacco comments:    1 Pack a day  Vaping Use   Vaping Use: Never used  Substance Use Topics   Alcohol use: Yes    Alcohol/week: 3.0 standard drinks of alcohol    Types: 3 Standard drinks or equivalent per week    Comment: 5th per week.     Drug use: No     Allergies   Aleve [naproxen sodium], Ibuprofen, and Nsaids   Review of Systems Review of Systems Per HPI  Physical Exam Triage Vital Signs ED Triage Vitals  Enc Vitals Group     BP 05/30/22 1549 (!) 142/98     Pulse Rate 05/30/22 1549 83     Resp 05/30/22 1549 18     Temp 05/30/22 1549 99.7 F (37.6 C)     Temp Source 05/30/22 1549 Oral     SpO2 05/30/22 1549 95 %     Weight --      Height --      Head Circumference --      Peak Flow --       Pain Score 05/30/22 1548 5     Pain Loc --      Pain Edu? --  Excl. in GC? --    No data found.  Updated Vital Signs BP (!) 142/98 (BP Location: Right Arm)   Pulse 83   Temp 99.7 F (37.6 C) (Oral)   Resp 18   LMP 01/19/2013   SpO2 95%   Visual Acuity Right Eye Distance:   Left Eye Distance:   Bilateral Distance:    Right Eye Near:   Left Eye Near:    Bilateral Near:     Physical Exam Constitutional:      General: She is not in acute distress.    Appearance: Normal appearance. She is not toxic-appearing or diaphoretic.  HENT:     Head: Normocephalic and atraumatic.     Right Ear: Tympanic membrane and ear canal normal.     Left Ear: Tympanic membrane and ear canal normal.     Nose: Nose normal.     Mouth/Throat:     Mouth: Mucous membranes are moist.     Pharynx: No posterior oropharyngeal erythema.  Eyes:     Extraocular Movements: Extraocular movements intact.     Conjunctiva/sclera: Conjunctivae normal.  Cardiovascular:     Rate and Rhythm: Normal rate and regular rhythm.     Pulses: Normal pulses.     Heart sounds: Normal heart sounds.  Pulmonary:     Effort: Pulmonary effort is normal. No respiratory distress.     Breath sounds: Normal breath sounds.  Feet:     Comments: Patient has erythema present to the left great toe.  Tenderness to palpation throughout as well.  Capillary refill and pulses intact.  Patient can wiggle toe. Skin:    General: Skin is warm and dry.  Neurological:     General: No focal deficit present.     Mental Status: She is alert and oriented to person, place, and time. Mental status is at baseline.  Psychiatric:        Mood and Affect: Mood normal.        Behavior: Behavior normal.        Thought Content: Thought content normal.        Judgment: Judgment normal.      UC Treatments / Results  Labs (all labs ordered are listed, but only abnormal results are displayed) Labs Reviewed  URIC ACID    EKG   Radiology DG  Chest 2 View  Result Date: 05/30/2022 CLINICAL DATA:  cough EXAM: CHEST - 2 VIEW COMPARISON:  July 31, 2021 FINDINGS: The cardiomediastinal silhouette is unchanged in contour. No pleural effusion. No pneumothorax. No acute pleuroparenchymal abnormality. Visualized abdomen is unremarkable. Multilevel degenerative changes of the thoracic spine. IMPRESSION: No acute cardiopulmonary abnormality. Electronically Signed   By: Valentino Saxon M.D.   On: 05/30/2022 16:47   DG Toe Great Left  Result Date: 05/30/2022 CLINICAL DATA:  toe pain EXAM: LEFT GREAT TOE COMPARISON:  None Available. FINDINGS: No acute fracture or dislocation. Joint spaces and alignment are maintained. No area of erosion or osseous destruction. No unexpected radiopaque foreign body. Soft tissues are unremarkable. IMPRESSION: No acute fracture or dislocation. No radiographically evident crystalline arthropathy. Electronically Signed   By: Valentino Saxon M.D.   On: 05/30/2022 16:46    Procedures Procedures (including critical care time)  Medications Ordered in UC Medications - No data to display  Initial Impression / Assessment and Plan / UC Course  I have reviewed the triage vital signs and the nursing notes.  Pertinent labs & imaging results that were available during my care of  the patient were reviewed by me and considered in my medical decision making (see chart for details).     Chest x-ray was negative for any acute cardiopulmonary process.  Suspect possible persistent viral cough.  Do not think viral testing is necessary given duration of symptoms as it would not change treatment.  There is no indication for antibiotic therapy for bacterial infection on physical exam or chest x-ray.  Will treat with cough medication and prednisone steroid therapy.  I am highly suspicious that patient has gout of the left great toe given physical exam.  X-ray of the left toe was negative for any acute bony abnormality.  Uric acid  level pending.  Low suspicion for cellulitis or bacterial infection.  Prednisone should also treat this.  Patient cannot take NSAIDs or colchicine given that she has 1 kidney but prednisone should be safe with this as there is no dosage adjustment necessary for kidney disease per up-to-date.  Patient was advised to follow-up if any symptoms persist or worsen.  Patient verbalized understanding and was agreeable with plan. Final Clinical Impressions(s) / UC Diagnoses   Final diagnoses:  Great toe pain, left  Persistent cough for 3 weeks or longer     Discharge Instructions      I am highly suspicious that you have gout in your toe.  Your x-ray is normal.  I have prescribed prednisone to help treat this and your cough.  Chest x-ray was also negative.  Uric acid level to check for gout is pending.  Will call if it is abnormal.  Please follow-up if any symptoms persist or worsen.     ED Prescriptions     Medication Sig Dispense Auth. Provider   predniSONE (DELTASONE) 20 MG tablet Take 2 tablets (40 mg total) by mouth daily for 5 days. 10 tablet [place], [place], [place]   benzonatate (TESSALON) 100 MG capsule Take 1 capsule (100 mg total) by mouth every 8 (eight) hours as needed for cough. 21 capsule [place], Michele Rockers, [place]      PDMP not reviewed this encounter.   Teodora Medici,  05/30/22 [phone]

## 2022-05-30 NOTE — Telephone Encounter (Signed)
I am awaiting UC note. Per EMR, she is there.  I think getting checked makes sense.  Thanks.

## 2022-05-30 NOTE — ED Triage Notes (Signed)
Patient also c/o of cough since 12/25. Taking mucinex. No improvement.

## 2022-05-30 NOTE — Telephone Encounter (Addendum)
Judeen Hammans RN with access nurse said pts disposition is to be seen within 3 - 4 hrs. No available appts at [facility], [place] and pt per Judeen Hammans wants appt at [place]; no available appts there either. Pt lives in [place] will instruct pt of disposition of UC or ED.  Sending note to Dr Damita Dunnings and Damita Dunnings pool and will speak with  [facility] CMA. Per chart review tab pt is at [facility] on [address].     [place] Day - Client TELEPHONE ADVICE RECORD AccessNurse Patient Name: Nichole Burke NSON Gender: Female DOB: 18-May-1960 Age: 62 Y 65 M 24 D Return Phone Number: [phone] (Primary) Address: City/ State/ Zip: [place]  [address] Client [place] [facility] - Client Client Site [place] - Day Provider Renford Dills - MD Contact Type Call Who Is Calling Patient / Member / Family / Caregiver Call Type Triage / Clinical Relationship To Patient Self Return Phone Number [phone] (Primary) Chief Complaint Walking difficulty Reason for Call Symptomatic / Request for Health Information Initial Comment Office transferred caller. Caller states her left big toe is swollen, painfulm and she can barely walk. She thinks she is having a flare of of gout. She took some Tylenol this morning. She only has one kidney so she is not able to take Aleve, Advil, ect. Translation No Nurse Assessment Nurse: Ottis Stain, RN, Sherrie Date/Time Eilene Ghazi Time): 05/30/2022 2:16:09 PM Confirm and document reason for call. If symptomatic, describe symptoms. ---Caller states left big toe is swollen, painful, some redness. States cannot bend it with out hurting. Also warm to the touch. When sitting 6-7/10 pain when walking 10/10. Started this am. Never had before. Taking Tylenol but is not helping. Unable to NSAID's due to only one kidney. Does the patient have any new or worsening symptoms? ---Yes Will a triage be completed? ---Yes Related  visit to physician within the last 2 weeks? ---No Does the PT have any chronic conditions? (i.e. diabetes, asthma, this includes High risk factors for pregnancy, etc.) ---Yes List chronic conditions. ---HTN Is this a behavioral health or substance abuse call? ---No Guidelines Guideline Title Affirmed Question Affirmed Notes Nurse Date/Time (Eastern Time) Toe Pain [1] SEVERE pain (e.g., excruciating, unable to do any normal activities) AND [2] not Ottis Stain, Therapist, sports, [place] 05/30/2022 2:19:00 PM PLEASE NOTE: All timestamps contained within this report are represented as [place] Standard Time. CONFIDENTIALTY NOTICE: This fax transmission is intended only for the addressee. It contains information that is legally privileged, confidential or otherwise protected from use or disclosure. If you are not the intended recipient, you are strictly prohibited from reviewing, disclosing, copying using or disseminating any of this information or taking any action in reliance on or regarding this information. If you have received this fax in error, please notify us immediately by telephone so that we can arrange for its return to [place]. Phone: [phone], Toll-Free: [phone], Fax: [phone] Page: 2 of 2 Call Id: [id number] Guidelines Guideline Title Affirmed Question Affirmed Notes Nurse Date/Time Eilene Ghazi Time) improved after 2 hours of pain medicine Disp. Time Eilene Ghazi Time) Disposition Final User 05/30/2022 2:26:20 PM Called On-Call Provider Ottis Stain, RN, [place] Reason: Called office and spoke with Gaspar Bidding about appt. States no appts in office. Asked about appt in other offices. Talking with Chalsea Darko and no appts available. 05/30/2022 2:28:51 PM See HCP within 4 Hours (or PCP triage) Yes Ottis Stain RN, Sherrie Final Disposition 05/30/2022 2:28:51 PM See HCP within 4 Hours (or  PCP triage) Yes Ottis Stain, RN, Sherrie Caller Disagree/Comply Comply Caller Understands Yes PreDisposition InappropriateToAsk Care  Advice Given Per Guideline SEE HCP (OR PCP TRIAGE) WITHIN 4 HOURS: * IF OFFICE WILL BE OPEN: You need to be seen within the next 3 or 4 hours. Call your doctor (or NP/PA) now or as soon as the office opens. CALL BACK IF: * You become worse Referrals GO TO FACILITY UNDECIDE

## 2022-05-30 NOTE — ED Triage Notes (Signed)
Patient presents to UC for left big toe pain since this morning. Took tylenol. Thinks its gout. Pain worse with ambulation.

## 2022-05-30 NOTE — Discharge Instructions (Addendum)
I am highly suspicious that you have gout in your toe.  Your x-ray is normal.  I have prescribed prednisone to help treat this and your cough.  Chest x-ray was also negative.  Uric acid level to check for gout is pending.  Will call if it is abnormal.  Please follow-up if any symptoms persist or worsen.

## 2022-05-30 NOTE — Telephone Encounter (Signed)
Patient called in stating she believes she is having a gout flare up. She stated that her left big toe is swollen and she can barely walk. Sent over to access nurse.

## 2022-06-01 LAB — URIC ACID: Uric Acid: 7.5 mg/dL — ABNORMAL HIGH (ref 3.0–7.2)

## 2022-06-01 NOTE — Telephone Encounter (Signed)
Please get update on patient.  Thanks. 

## 2022-06-02 NOTE — Telephone Encounter (Signed)
Patient states that she is doing well with Prednisone. She has improvement with pain. She will call if any questions.

## 2022-06-09 ENCOUNTER — Ambulatory Visit (INDEPENDENT_AMBULATORY_CARE_PROVIDER_SITE_OTHER): Payer: Commercial Managed Care - HMO | Admitting: Internal Medicine

## 2022-06-09 ENCOUNTER — Encounter: Payer: Self-pay | Admitting: Internal Medicine

## 2022-06-09 VITALS — BP 104/70 | HR 84 | Temp 97.5°F | Ht 67.0 in | Wt 221.0 lb

## 2022-06-09 DIAGNOSIS — J0140 Acute pansinusitis, unspecified: Secondary | ICD-10-CM | POA: Diagnosis not present

## 2022-06-09 MED ORDER — PREDNISONE 20 MG PO TABS: 40.0000 mg | ORAL_TABLET | Freq: Every day | ORAL | 0 refills | Status: DC

## 2022-06-09 MED ORDER — AMOXICILLIN-POT CLAVULANATE 875-125 MG PO TABS: 1.0000 | ORAL_TABLET | Freq: Two times a day (BID) | ORAL | 0 refills | Status: DC

## 2022-06-09 MED ORDER — BENZONATATE 200 MG PO CAPS: 200.0000 mg | ORAL_CAPSULE | Freq: Three times a day (TID) | ORAL | 0 refills | Status: DC | PRN

## 2022-06-09 NOTE — Assessment & Plan Note (Addendum)
Might have started with COVID infection but never checked Will treat with augmentin--she tolerated this in 2022 Refill tessalon--higher dose A few days of prednisone---discussed that she really needs to try to stop smoking

## 2022-06-09 NOTE — Progress Notes (Signed)
Subjective:    Patient ID: Nichole Burke, female    DOB: 1960-09-26, 62 y.o.   MRN: [id number]  HPI Here due to a respiratory infection  Was sick at Christmas--in bed for 2 days Seemed to go away--but had lingering cough 2 weeks ago---had gout in foot. Went to urgent care Also with cough then---got benzonatate (which she likes)  Cough has persisted Bad nasal congestion Frontal head pain Some pain into teeth No ear pain or sore throat No fever No SOB--other than with the cough  Didn't test for COVID  Taking allergy pills--may help some  Current Outpatient Medications on File Prior to Visit  Medication Sig Dispense Refill   aspirin EC 81 MG tablet Take 81 mg by mouth daily.     benzonatate (TESSALON) 100 MG capsule Take 1 capsule (100 mg total) by mouth every 8 (eight) hours as needed for cough. 21 capsule 0   lisinopril (ZESTRIL) 20 MG tablet TAKE 1 TABLET BY MOUTH EVERY DAY 90 tablet 3   metoprolol succinate (TOPROL-XL) 25 MG 24 hr tablet TAKE 1 TABLET (25 MG TOTAL) BY MOUTH DAILY. 90 tablet 3   Multiple Vitamin (MULTIVITAMIN) tablet Take 1 tablet by mouth daily.     pantoprazole (PROTONIX) 40 MG tablet TAKE 1 TABLET BY MOUTH EVERY DAY 90 tablet 3   sertraline (ZOLOFT) 100 MG tablet TAKE 1/2 TABLET BY MOUTH DAILY 90 tablet 1   No current facility-administered medications on file prior to visit.    Allergies  Allergen Reactions   Aleve [Naproxen Sodium] Other (See Comments)    H/o nephrectomy    Ibuprofen Other (See Comments)    H/o nephrectomy    Nsaids Other (See Comments)    H/o nephrectomy     Past Medical History:  Diagnosis Date   Anxiety    Aortic atherosclerosis (HCC)    Diverticulosis    Elevated LFTs    GERD (gastroesophageal reflux disease)    Hepatic steatosis    History of hiatal hernia    HTN (hypertension)    Internal hemorrhoids    Plantar fasciitis    Right   Pure hypercholesterolemia    Sleep apnea    in home sleep study; OSA; pt does  not have CPAP   Tobacco abuse    Tremor    Tubular adenoma of colon    Wears glasses     Past Surgical History:  Procedure Laterality Date   LIPOMA EXCISION N/A 10/31/2015   Procedure: OPEN EXCISION LIPOMA;  Surgeon: Alexis Frock, MD;  Location: [facility];  Service: Urology;  Laterality: N/A;   PELVIC LYMPH NODE DISSECTION Bilateral 10/31/2015   Procedure: PELVIC LYMPH NODE DISSECTION;  Surgeon: Alexis Frock, MD;  Location: [facility];  Service: Urology;  Laterality: Bilateral;   ROBOT ASSISTED LAPAROSCOPIC NEPHRECTOMY Left 10/31/2015   Procedure: XI ROBOTIC ASSISTED LAPAROSCOPIC NEPHRECTOMY;  Surgeon: Alexis Frock, MD;  Location: [facility];  Service: Urology;  Laterality: Left;   TUBAL LIGATION  1995   after pregnancy with blighted ovum    Family History  Problem Relation Age of Onset   Heart attack Father 88   Heart disease Father    Coronary artery disease Father    Heart attack Mother    Coronary artery disease Mother    Diabetes Mother    Heart disease Mother    Diabetes Brother    Kidney cancer Brother    Breast cancer Neg Hx    Colon cancer Neg Hx  Colon polyps Neg Hx    Rectal cancer Neg Hx    Stomach cancer Neg Hx    Esophageal cancer Neg Hx     Social History   Socioeconomic History   Marital status: Married    Spouse name: Not on file   Number of children: 0   Years of education: Not on file   Highest education level: Not on file  Occupational History   Occupation: Waitress-[employer]  Tobacco Use   Smoking status: Every Day    Packs/day: 1.00    Years: 40.00    Total pack years: 40.00    Types: Cigarettes   Smokeless tobacco: Never   Tobacco comments:    1 Pack a day  Vaping Use   Vaping Use: Never used  Substance and Sexual Activity   Alcohol use: Yes    Alcohol/week: 3.0 standard drinks of alcohol    Types: 3 Standard drinks or equivalent per week    Comment: 5th per week.     Drug use: No   Sexual activity: Not on file  Other Topics Concern    Not on file  Social History Narrative   Married since 1987; lives with husband   No children   Waitress at [employer]   Only exercise is at work   Social Determinants of Radio broadcast assistant Strain: Not on file  Food Insecurity: Not on file  Transportation Needs: Not on file  Physical Activity: Not on file  Stress: Not on file  Social Connections: Not on file  Intimate Partner Violence: Not on file   Review of Systems Taste is gone No N/V---just gags with post nasal drip Eating okay    Objective:   Physical Exam Constitutional:      Appearance: Normal appearance.  HENT:     Head:     Comments: Mild frontal/maxillary tenderness    Nose: Congestion present.     Mouth/Throat:     Pharynx: No oropharyngeal exudate or posterior oropharyngeal erythema.  Pulmonary:     Effort: Pulmonary effort is normal.     Breath sounds: No rales.     Comments: Mild expiratory prolongation and scant wheeze Musculoskeletal:     Cervical back: Neck supple.  Lymphadenopathy:     Cervical: No cervical adenopathy.  Neurological:     Mental Status: She is alert.            Assessment & Plan:

## 2022-06-15 ENCOUNTER — Other Ambulatory Visit: Payer: Self-pay | Admitting: Family Medicine

## 2022-06-15 ENCOUNTER — Other Ambulatory Visit: Payer: Self-pay | Admitting: Internal Medicine

## 2022-06-18 ENCOUNTER — Other Ambulatory Visit: Payer: Self-pay | Admitting: Family Medicine

## 2022-07-12 ENCOUNTER — Other Ambulatory Visit: Payer: Self-pay | Admitting: Family Medicine

## 2022-07-13 ENCOUNTER — Other Ambulatory Visit: Payer: Self-pay | Admitting: Internal Medicine

## 2022-07-13 ENCOUNTER — Other Ambulatory Visit: Payer: Self-pay | Admitting: Family Medicine

## 2022-07-23 ENCOUNTER — Ambulatory Visit (INDEPENDENT_AMBULATORY_CARE_PROVIDER_SITE_OTHER): Payer: Commercial Managed Care - HMO | Admitting: Internal Medicine

## 2022-07-23 ENCOUNTER — Encounter: Payer: Self-pay | Admitting: Internal Medicine

## 2022-07-23 VITALS — BP 122/80 | HR 68 | Temp 97.8°F | Ht 67.0 in | Wt 229.0 lb

## 2022-07-23 DIAGNOSIS — M109 Gout, unspecified: Secondary | ICD-10-CM | POA: Insufficient documentation

## 2022-07-23 DIAGNOSIS — M1000 Idiopathic gout, unspecified site: Secondary | ICD-10-CM | POA: Diagnosis not present

## 2022-07-23 MED ORDER — COLCHICINE 0.6 MG PO TABS: 0.6000 mg | ORAL_TABLET | Freq: Two times a day (BID) | ORAL | 0 refills | Status: DC | PRN

## 2022-07-23 NOTE — Patient Instructions (Addendum)
Try the colchicine first thing and then repeat it in 1-2 hours. If the gout is better, take 2 the next day, then 1 daily for 2 days, then stop  Low-Purine Eating Plan A low-purine eating plan involves making food choices to limit your purine intake. Purine is a kind of uric acid. Too much uric acid in your blood can cause certain conditions, such as gout and kidney stones. Eating a low-purine diet may help control these conditions. What are tips for following this plan? Shopping Avoid buying products that contain high-fructose corn syrup. Check for this on food labels. It is commonly found in many processed foods and soft drinks. Be sure to check for it in baked goods such as cookies, canned fruits, and cereals and cereal bars. Avoid buying veal, chicken breast with skin, lamb, and organ meats such as liver. These types of meats tend to have the highest purine content. Choose dairy products. These may lower uric acid levels. Avoid certain types of fish. Not all fish and seafood have high purine content. Examples with high purine content include anchovies, trout, tuna, sardines, and salmon. Avoid buying beverages that contain alcohol, particularly beer and hard liquor. Alcohol can affect the way your body gets rid of uric acid. Meal planning  Learn which foods do or do not affect you. If you find out that a food tends to cause your gout symptoms to flare up, avoid eating that food. You can enjoy foods that do not cause problems. If you have any questions about a food item, talk with your dietitian or health care provider. Reduce the overall amount of meat in your diet. When you do eat meat, choose ones with lower purine content. Include plenty of fruits and vegetables. Although some vegetables may have a high purine content--such as asparagus, mushrooms, spinach, or cauliflower--it has been shown that these do not contribute to uric acid blood levels as much. Consume at least 1 dairy serving a day.  This has been shown to decrease uric acid levels. General information If you drink alcohol: Limit how much you have to: 0-1 drink a day for women who are not pregnant. 0-2 drinks a day for men. Know how much alcohol is in a drink. In the [place] one drink equals one 12 oz bottle of beer (355 mL), one 5 oz glass of wine (148 mL), or one 1 oz glass of hard liquor (44 mL). Drink plenty of water. Try to drink enough to keep your urine pale yellow. Fluids can help remove uric acid from your body. Work with your health care provider and dietitian to develop a plan to achieve or maintain a healthy weight. Losing weight may help reduce uric acid in your blood. What foods are recommended? The following are some types of foods that are good choices when limiting purine intake: Fresh or frozen fruits and vegetables. Whole grains, breads, cereals, and pasta. Rice. Beans, peas, legumes. Nuts and seeds. Dairy products. Fats and oils. The items listed above may not be a complete list. Talk with a dietitian about what dietary choices are best for you. What foods are not recommended? Limit your intake of foods high in purines, including: Beer and other alcohol. Meat-based gravy or sauce. Canned or fresh fish, such as: Anchovies, sardines, herring, salmon, and tuna. Mussels and scallops. Codfish, trout, and haddock. Bacon, veal, chicken breast with skin, and lamb. Organ meats, such as: Liver or kidney. Tripe. Sweetbreads (thymus gland or pancreas). Wild Clinical biochemist. Yeast or  yeast extract supplements. Drinks sweetened with high-fructose corn syrup, such as soda. Processed foods made with high-fructose corn syrup. The items listed above may not be a complete list of foods and beverages you should limit. Contact a dietitian for more information. Summary Eating a low-purine diet may help control conditions caused by too much uric acid in the body, such as gout or kidney stones. Choose low-purine  foods, limit alcohol, and limit high-fructose corn syrup. You will learn over time which foods do or do not affect you. If you find out that a food tends to cause your gout symptoms to flare up, avoid eating that food. This information is not intended to replace advice given to you by your health care provider. Make sure you discuss any questions you have with your health care provider. Document Revised: 04/04/2021 Document Reviewed: 04/04/2021 Elsevier Patient Education  [place].

## 2022-07-23 NOTE — Progress Notes (Signed)
Subjective:    Patient ID: Nichole Burke, female    DOB: 1961-03-21, 62 y.o.   MRN: [id number]  HPI Here due to recurrence of left great toe pain With husband  First time the pain came on was in January Went to ER---uric acid mildly elevated then Got prednisone  Pain recurred 2 days ago--mild Then bad yesterday  Hasn't taken anything  Current Outpatient Medications on File Prior to Visit  Medication Sig Dispense Refill   aspirin EC 81 MG tablet Take 81 mg by mouth daily.     lisinopril (ZESTRIL) 20 MG tablet TAKE 1 TABLET BY MOUTH EVERY DAY 30 tablet 3   metoprolol succinate (TOPROL-XL) 25 MG 24 hr tablet TAKE 1 TABLET (25 MG TOTAL) BY MOUTH DAILY. 30 tablet 3   Multiple Vitamin (MULTIVITAMIN) tablet Take 1 tablet by mouth daily.     pantoprazole (PROTONIX) 40 MG tablet TAKE 1 TABLET BY MOUTH EVERY DAY 30 tablet 3   sertraline (ZOLOFT) 100 MG tablet TAKE 1/2 TABLET BY MOUTH DAILY 45 tablet 2   No current facility-administered medications on file prior to visit.    Allergies  Allergen Reactions   Aleve [Naproxen Sodium] Other (See Comments)    H/o nephrectomy    Ibuprofen Other (See Comments)    H/o nephrectomy    Nsaids Other (See Comments)    H/o nephrectomy     Past Medical History:  Diagnosis Date   Anxiety    Aortic atherosclerosis (HCC)    Diverticulosis    Elevated LFTs    GERD (gastroesophageal reflux disease)    Hepatic steatosis    History of hiatal hernia    HTN (hypertension)    Internal hemorrhoids    Plantar fasciitis    Right   Pure hypercholesterolemia    Sleep apnea    in home sleep study; OSA; pt does not have CPAP   Tobacco abuse    Tremor    Tubular adenoma of colon    Wears glasses     Past Surgical History:  Procedure Laterality Date   LIPOMA EXCISION N/A 10/31/2015   Procedure: OPEN EXCISION LIPOMA;  Surgeon: Alexis Frock, MD;  Location: [facility];  Service: Urology;  Laterality: N/A;   PELVIC LYMPH NODE DISSECTION Bilateral  10/31/2015   Procedure: PELVIC LYMPH NODE DISSECTION;  Surgeon: Alexis Frock, MD;  Location: [facility];  Service: Urology;  Laterality: Bilateral;   ROBOT ASSISTED LAPAROSCOPIC NEPHRECTOMY Left 10/31/2015   Procedure: XI ROBOTIC ASSISTED LAPAROSCOPIC NEPHRECTOMY;  Surgeon: Alexis Frock, MD;  Location: [facility];  Service: Urology;  Laterality: Left;   TUBAL LIGATION  1995   after pregnancy with blighted ovum    Family History  Problem Relation Age of Onset   Heart attack Father 75   Heart disease Father    Coronary artery disease Father    Heart attack Mother    Coronary artery disease Mother    Diabetes Mother    Heart disease Mother    Diabetes Brother    Kidney cancer Brother    Breast cancer Neg Hx    Colon cancer Neg Hx    Colon polyps Neg Hx    Rectal cancer Neg Hx    Stomach cancer Neg Hx    Esophageal cancer Neg Hx     Social History   Socioeconomic History   Marital status: Married    Spouse name: Not on file   Number of children: 0   Years of education: Not on  file   Highest education level: Not on file  Occupational History   Occupation: Waitress-[employer]  Tobacco Use   Smoking status: Every Day    Packs/day: 1.00    Years: 40.00    Additional pack years: 0.00    Total pack years: 40.00    Types: Cigarettes   Smokeless tobacco: Never   Tobacco comments:    1 Pack a day  Vaping Use   Vaping Use: Never used  Substance and Sexual Activity   Alcohol use: Yes    Alcohol/week: 3.0 standard drinks of alcohol    Types: 3 Standard drinks or equivalent per week    Comment: 5th per week.     Drug use: No   Sexual activity: Not on file  Other Topics Concern   Not on file  Social History Narrative   Married since 1987; lives with husband   No children   Waitress at [employer]   Only exercise is at work   Social Determinants of Radio broadcast assistant Strain: Not on file  Food Insecurity: Not on file  Transportation Needs: Not on file  Physical  Activity: Not on file  Stress: Not on file  Social Connections: Not on file  Intimate Partner Violence: Not on file   Review of Systems No fever No injury Slight foot tingling/feel cold    Objective:   Physical Exam Constitutional:      Appearance: Normal appearance.  Musculoskeletal:     Comments: Inflammation and tenderness in PIP of left great toe  Neurological:     Mental Status: She is alert.            Assessment & Plan:

## 2022-07-23 NOTE — Assessment & Plan Note (Signed)
Not MCP but still fairly classic GFR only borderline low---okay to use colchicine briefly Try 2 a day--if better, take for 3-4 days, then stop Low purine information Might want to consider allopurinol if recurrent

## 2022-08-14 ENCOUNTER — Other Ambulatory Visit: Payer: Self-pay | Admitting: Internal Medicine

## 2022-09-14 ENCOUNTER — Other Ambulatory Visit: Payer: Self-pay | Admitting: Internal Medicine

## 2022-09-18 ENCOUNTER — Ambulatory Visit (INDEPENDENT_AMBULATORY_CARE_PROVIDER_SITE_OTHER): Payer: Commercial Managed Care - HMO | Admitting: Family Medicine

## 2022-09-18 ENCOUNTER — Encounter: Payer: Self-pay | Admitting: Family Medicine

## 2022-09-18 VITALS — BP 112/80 | HR 81 | Temp 98.2°F | Ht 67.0 in | Wt 228.0 lb

## 2022-09-18 DIAGNOSIS — I10 Essential (primary) hypertension: Secondary | ICD-10-CM | POA: Diagnosis not present

## 2022-09-18 DIAGNOSIS — M1000 Idiopathic gout, unspecified site: Secondary | ICD-10-CM

## 2022-09-18 MED ORDER — ALLOPURINOL 100 MG PO TABS: 50.0000 mg | ORAL_TABLET | Freq: Every day | ORAL | 1 refills | Status: DC

## 2022-09-18 NOTE — Patient Instructions (Addendum)
Go to the lab on the way out.   If you have mychart we'll likely use that to update you.     Take colchicine daily until the gout flare is gone.  When you have had a few days without new pain or colchicine, then start 1/2 tab allopurinol a day.    Let me know how that goes.  Take care.  Glad to see you.  Yearly visit this fall.  Labs before at [facility]- call them for a lab appointment.    Address: [address] [place], [place], [place] [address] Phone: [phone]

## 2022-09-18 NOTE — Progress Notes (Signed)
R 1st MTP sore and red.  Similar L 1st IP joint.  This flare started 2 days ago.  Today is the best day but not resolved.  No fevers.  Has used colchicine yesterday AM.  No dose today yet.  Only used PRN.  Discussed option for prophylactic medications.  Previous creatinine noted and discussed with patient.  Discussed alcohol taper.  Gout pathophysiology discussed with patient.  Meds, vitals, and allergies reviewed.   ROS: Per HPI unless specifically indicated in ROS section   Nad Ncat Neck supple, no LA Rrr Ctab Abd soft, not ttp R 1st MTP sore and red.  Similar L 1st IP joint.  Skin normal in the feet otherwise bilaterally.  See notes on labs.

## 2022-09-19 LAB — BASIC METABOLIC PANEL
BUN: 15 mg/dL (ref 6–23)
CO2: 28 mEq/L (ref 19–32)
Calcium: 9.6 mg/dL (ref 8.4–10.5)
Chloride: 104 mEq/L (ref 96–112)
Creatinine, Ser: 1.09 mg/dL (ref 0.40–1.20)
GFR: 55.4 mL/min — ABNORMAL LOW (ref >60.00)
Glucose, Bld: 96 mg/dL (ref 70–99)
Potassium: 4.1 mEq/L (ref 3.5–5.1)
Sodium: 141 mEq/L (ref 135–145)

## 2022-09-21 ENCOUNTER — Other Ambulatory Visit: Payer: Self-pay | Admitting: Family Medicine

## 2022-09-21 NOTE — Assessment & Plan Note (Signed)
Take colchicine daily until the gout flare is gone.  When have had a few days without new pain or colchicine, then start 1/2 tab allopurinol a day.    Let me know how that goes.   Yearly visit this fall.  Labs before at [facility]- call them for a lab appointment.

## 2022-10-12 ENCOUNTER — Other Ambulatory Visit: Payer: Self-pay | Admitting: Family Medicine

## 2022-10-12 ENCOUNTER — Other Ambulatory Visit: Payer: Self-pay | Admitting: Internal Medicine

## 2022-11-16 ENCOUNTER — Other Ambulatory Visit: Payer: Self-pay | Admitting: Family Medicine

## 2022-11-16 ENCOUNTER — Other Ambulatory Visit: Payer: Self-pay | Admitting: Internal Medicine

## 2023-01-11 ENCOUNTER — Other Ambulatory Visit: Payer: Self-pay | Admitting: Internal Medicine

## 2023-02-15 ENCOUNTER — Other Ambulatory Visit: Payer: Self-pay | Admitting: Family Medicine

## 2023-02-16 ENCOUNTER — Other Ambulatory Visit: Payer: Self-pay | Admitting: Internal Medicine

## 2023-02-16 NOTE — Telephone Encounter (Signed)
Patient called in to follow up on this refill request 

## 2023-03-15 ENCOUNTER — Other Ambulatory Visit: Payer: Self-pay | Admitting: Internal Medicine

## 2023-03-15 ENCOUNTER — Other Ambulatory Visit: Payer: Self-pay | Admitting: Family Medicine

## 2023-04-12 ENCOUNTER — Other Ambulatory Visit: Payer: Self-pay | Admitting: Internal Medicine

## 2023-05-08 ENCOUNTER — Other Ambulatory Visit: Payer: Self-pay | Admitting: Family Medicine

## 2023-05-10 ENCOUNTER — Other Ambulatory Visit: Payer: Self-pay | Admitting: Internal Medicine

## 2023-05-10 ENCOUNTER — Other Ambulatory Visit: Payer: Self-pay | Admitting: Family Medicine

## 2023-05-25 ENCOUNTER — Encounter: Payer: Self-pay | Admitting: Family Medicine

## 2023-05-25 ENCOUNTER — Ambulatory Visit: Payer: Commercial Managed Care - HMO | Admitting: Family Medicine

## 2023-05-25 VITALS — BP 108/66 | HR 73 | Temp 98.4°F | Ht 67.0 in | Wt 225.0 lb

## 2023-05-25 DIAGNOSIS — K529 Noninfective gastroenteritis and colitis, unspecified: Secondary | ICD-10-CM

## 2023-05-25 MED ORDER — ONDANSETRON HCL 4 MG PO TABS: 4.0000 mg | ORAL_TABLET | Freq: Three times a day (TID) | ORAL | 0 refills | Status: AC | PRN

## 2023-05-25 NOTE — Patient Instructions (Addendum)
Schedule a yearly visit in the spring of 2025, when possible.  We can do the labs at the visit.   Take care.  Glad to see you. Skip the lisinopril until you feel better.   Try zofran for vomiting.  You can try taking imodium for the diarrhea.   Drink enough fluid to keep your urine clear or light yellow.    Update [place] as needed.  The vomiting should get better before the diarrhea.   Likely gastroenteritis.

## 2023-05-25 NOTE — Progress Notes (Unsigned)
05/21/23.  Vomiting and diarrhea.  Last vomited this AM.  No fevers.  Abd wall sore after vomiting.  No known sick contacts.  No blood in stool.  No bloody vomitus.  Mucous in vomitus.  Watery stools.  No recent abx use.  Waxing and waning sx in the meantime.  Fatigued.  No muscle aches but feels generally weak.  Last UOP was today, recent.  Urine is yellow.  Mildly lightheaded on standing.  Hasn't tried any meds yet for nausea.  No travel or new foods.  Husband has dementia but not having GI sx.  On city water.    Meds, vitals, and allergies reviewed.   ROS: Per HPI unless specifically indicated in ROS section   GEN: nad, alert and oriented HEENT: ncat NECK: supple w/o LA CV: rrr.  PULM: ctab, no inc wob ABD: soft, +bs, no rebound.  EXT: no edema SKIN: well perfused

## 2023-05-27 DIAGNOSIS — K529 Noninfective gastroenteritis and colitis, unspecified: Secondary | ICD-10-CM | POA: Insufficient documentation

## 2023-05-27 NOTE — Assessment & Plan Note (Signed)
Likely gastroenteritis- known to be present in the community.  Skip lisinopril until feeling better.  Try zofran for vomiting.  Can try taking imodium for the diarrhea.   Drink enough fluid to keep urine clear or light yellow.   Update [place] as needed.  The vomiting should get better before the diarrhea.  At this point, okay for outpatient f/u.

## 2023-07-12 ENCOUNTER — Other Ambulatory Visit: Payer: Self-pay | Admitting: Family Medicine

## 2023-07-15 ENCOUNTER — Ambulatory Visit: Payer: Self-pay | Admitting: Family Medicine

## 2023-07-15 NOTE — Telephone Encounter (Signed)
Noted. Thanks. Will see at OV.  

## 2023-07-15 NOTE — Telephone Encounter (Signed)
 Pt calling in stating that she missed a call from this number. Pt does not have any new symptoms or any worsening. RN advised pt RN would look into it and call her back if we needed more information. RN called the CAL, CAL said it is likely it was an automated phone call reminding her of her Friday appt.  Reason for Disposition  General information question, no triage required and triager able to answer question  Answer Assessment - Initial Assessment Questions 1. REASON FOR CALL or QUESTION: "What is your reason for calling today?" or "How can I best help you?" or "What question do you have that I can help answer?"     Pt calling in stating that she missed a call from this number. Pt does not have any new symptoms or any worsening. This RN does not need any more information from the pt and is unsure why she received a call. RN advised pt RN would look into it and call her back if we needed more information.  Protocols used: Information Only Call - No Triage-A-AH

## 2023-07-15 NOTE — Telephone Encounter (Signed)
 Chief Complaint: Left heel pain Symptoms: Heel pain worsened when walking on it Frequency: 1.5 weeks Pertinent Negatives: Patient denies leg pain, fever, rash, numbness Disposition: [] ED /[] Urgent Care (no appt availability in office) / [x] Appointment(In office/virtual)/ []  [facility]/ [] Home Care/ [] Refused Recommended Disposition /[] [facility]/ []  Follow-up with PCP Additional Notes: B.J. calling reporting left heel pain on the bottom side of her heel, worsened when walking. Patient states unknown cause but it has been occurring constantly for a week and a half. Patient denied rash, fever, leg pain, weakness. This RN gave patient home advise on potential plantar fasciitis. Patient appt made for further evaluation. Patient would like Dr. Para March to examine a few spots on her face as well.    Copied from CRM [id number]. Topic: Clinical - Red Word Triage >> Jul 15, 2023  9:37 AM Gurney Maxin H wrote: [facility] that prompted transfer to Nurse Triage: Issue with left heel, hurts to walk on and put pressure on it sitting it throbs Reason for Disposition  [1] MODERATE pain (e.g., interferes with normal activities, limping) AND [2] present > 3 days  Answer Assessment - Initial Assessment Questions 1. ONSET: "When did the pain start?"      A week and a half 2. LOCATION: "Where is the pain located?"      Pain specifically in left heel, hurts worse when walking on and still hurts when not 3. PAIN: "How bad is the pain?"    (Scale 1-10; or mild, moderate, severe)  - MILD (1-3): doesn't interfere with normal activities.   - MODERATE (4-7): interferes with normal activities (e.g., work or school) or awakens from sleep, limping.   - SEVERE (8-10): excruciating pain, unable to do any normal activities, unable to walk.      10 4. WORK OR EXERCISE: "Has there been any recent work or exercise that involved this part of the body?"      No 5. CAUSE: "What do you think is causing the foot  pain?"     Unsure 6. OTHER SYMPTOMS: "Do you have any other symptoms?" (e.g., leg pain, rash, fever, numbness)     No  Protocols used: Foot Pain-A-AH

## 2023-07-17 ENCOUNTER — Ambulatory Visit: Admitting: Family Medicine

## 2023-07-17 ENCOUNTER — Telehealth: Payer: Self-pay | Admitting: Family Medicine

## 2023-07-17 NOTE — Telephone Encounter (Signed)
 Copied from CRM [id number]. Topic: Appointments - Appointment Info/Confirmation >> Jul 17, 2023 11:35 AM Truddie Crumble wrote: Patient/patient representative is calling for information regarding an appointment. Patient called back stating she usually get blood work done before her physical appointment and she want to see if she can get blood work done at the [facility] location

## 2023-07-19 NOTE — Telephone Encounter (Signed)
 Orders are in EMR.  Please have her check about getting scheduled at [facility].  Thanks.

## 2023-07-21 NOTE — Telephone Encounter (Signed)
 Spoke with patient and she has scheduled to come to the lab at [facility]

## 2023-07-22 ENCOUNTER — Other Ambulatory Visit (INDEPENDENT_AMBULATORY_CARE_PROVIDER_SITE_OTHER)

## 2023-07-22 DIAGNOSIS — M1000 Idiopathic gout, unspecified site: Secondary | ICD-10-CM

## 2023-07-22 DIAGNOSIS — I10 Essential (primary) hypertension: Secondary | ICD-10-CM | POA: Diagnosis not present

## 2023-07-22 LAB — CBC WITH DIFFERENTIAL/PLATELET
Basophils Absolute: 0 10*3/uL (ref 0.0–0.1)
Basophils Relative: 0.5 % (ref 0.0–3.0)
Eosinophils Absolute: 0.1 10*3/uL (ref 0.0–0.7)
Eosinophils Relative: 1.7 % (ref 0.0–5.0)
HCT: 43.6 % (ref 36.0–46.0)
Hemoglobin: 14.5 g/dL (ref 12.0–15.0)
Lymphocytes Relative: 23.2 % (ref 12.0–46.0)
Lymphs Abs: 2.1 10*3/uL (ref 0.7–4.0)
MCHC: 33.3 g/dL (ref 30.0–36.0)
MCV: 92.2 fl (ref 78.0–100.0)
Monocytes Absolute: 0.5 10*3/uL (ref 0.1–1.0)
Monocytes Relative: 5.4 % (ref 3.0–12.0)
Neutro Abs: 6.2 10*3/uL (ref 1.4–7.7)
Neutrophils Relative %: 69.2 % (ref 43.0–77.0)
Platelets: 129 10*3/uL — ABNORMAL LOW (ref 150.0–400.0)
RBC: 4.73 Mil/uL (ref 3.87–5.11)
RDW: 13.9 % (ref 11.5–15.5)
WBC: 8.9 10*3/uL (ref 4.0–10.5)

## 2023-07-22 LAB — URIC ACID: Uric Acid, Serum: 6.6 mg/dL (ref 2.4–7.0)

## 2023-07-22 LAB — COMPREHENSIVE METABOLIC PANEL
ALT: 40 U/L — ABNORMAL HIGH (ref 0–35)
AST: 37 U/L (ref 0–37)
Albumin: 4.3 g/dL (ref 3.5–5.2)
Alkaline Phosphatase: 89 U/L (ref 39–117)
BUN: 14 mg/dL (ref 6–23)
CO2: 33 meq/L — ABNORMAL HIGH (ref 19–32)
Calcium: 9.8 mg/dL (ref 8.4–10.5)
Chloride: 102 meq/L (ref 96–112)
Creatinine, Ser: 0.97 mg/dL (ref 0.40–1.20)
GFR: 63.35 mL/min (ref >60.00)
Glucose, Bld: 126 mg/dL — ABNORMAL HIGH (ref 70–99)
Potassium: 4.5 meq/L (ref 3.5–5.1)
Sodium: 142 meq/L (ref 135–145)
Total Bilirubin: 0.6 mg/dL (ref 0.2–1.2)
Total Protein: 6.3 g/dL (ref 6.0–8.3)

## 2023-07-22 LAB — LIPID PANEL
Cholesterol: 199 mg/dL (ref 0–200)
HDL: 47.3 mg/dL (ref >39.00)
LDL Cholesterol: 123 mg/dL — ABNORMAL HIGH (ref 0–99)
NonHDL: 151.34
Total CHOL/HDL Ratio: 4
Triglycerides: 144 mg/dL (ref 0.0–149.0)
VLDL: 28.8 mg/dL (ref 0.0–40.0)

## 2023-07-24 ENCOUNTER — Encounter: Payer: Self-pay | Admitting: Family Medicine

## 2023-07-24 ENCOUNTER — Ambulatory Visit (INDEPENDENT_AMBULATORY_CARE_PROVIDER_SITE_OTHER): Admitting: Family Medicine

## 2023-07-24 VITALS — BP 106/62 | HR 73 | Temp 98.8°F | Ht 66.5 in | Wt 227.0 lb

## 2023-07-24 DIAGNOSIS — Z23 Encounter for immunization: Secondary | ICD-10-CM | POA: Diagnosis not present

## 2023-07-24 DIAGNOSIS — Z0000 Encounter for general adult medical examination without abnormal findings: Secondary | ICD-10-CM | POA: Diagnosis not present

## 2023-07-24 DIAGNOSIS — I10 Essential (primary) hypertension: Secondary | ICD-10-CM

## 2023-07-24 DIAGNOSIS — M722 Plantar fascial fibromatosis: Secondary | ICD-10-CM

## 2023-07-24 DIAGNOSIS — Z85528 Personal history of other malignant neoplasm of kidney: Secondary | ICD-10-CM

## 2023-07-24 DIAGNOSIS — R739 Hyperglycemia, unspecified: Secondary | ICD-10-CM

## 2023-07-24 DIAGNOSIS — G4733 Obstructive sleep apnea (adult) (pediatric): Secondary | ICD-10-CM

## 2023-07-24 DIAGNOSIS — M109 Gout, unspecified: Secondary | ICD-10-CM

## 2023-07-24 DIAGNOSIS — L989 Disorder of the skin and subcutaneous tissue, unspecified: Secondary | ICD-10-CM | POA: Diagnosis not present

## 2023-07-24 DIAGNOSIS — Z7189 Other specified counseling: Secondary | ICD-10-CM

## 2023-07-24 DIAGNOSIS — M7060 Trochanteric bursitis, unspecified hip: Secondary | ICD-10-CM

## 2023-07-24 DIAGNOSIS — R251 Tremor, unspecified: Secondary | ICD-10-CM

## 2023-07-24 DIAGNOSIS — R202 Paresthesia of skin: Secondary | ICD-10-CM

## 2023-07-24 LAB — POCT GLYCOSYLATED HEMOGLOBIN (HGB A1C): Hemoglobin A1C: 5.9 % — AB (ref 4.0–5.6)

## 2023-07-24 MED ORDER — METOPROLOL SUCCINATE ER 25 MG PO TB24: 25.0000 mg | ORAL_TABLET | Freq: Every day | ORAL | 12 refills | Status: AC

## 2023-07-24 MED ORDER — SERTRALINE HCL 100 MG PO TABS: 50.0000 mg | ORAL_TABLET | Freq: Every day | ORAL | 12 refills | Status: AC

## 2023-07-24 MED ORDER — PANTOPRAZOLE SODIUM 40 MG PO TBEC: 40.0000 mg | DELAYED_RELEASE_TABLET | Freq: Every day | ORAL | 12 refills | Status: AC

## 2023-07-24 MED ORDER — LISINOPRIL 20 MG PO TABS: 20.0000 mg | ORAL_TABLET | Freq: Every day | ORAL | 12 refills | Status: AC

## 2023-07-24 MED ORDER — COLCHICINE 0.6 MG PO TABS: 0.6000 mg | ORAL_TABLET | Freq: Two times a day (BID) | ORAL | 0 refills | Status: AC | PRN

## 2023-07-24 MED ORDER — ALLOPURINOL 100 MG PO TABS: 50.0000 mg | ORAL_TABLET | Freq: Every day | ORAL | 12 refills | Status: AC

## 2023-07-24 NOTE — Patient Instructions (Addendum)
 Take care.  Glad to see you.  Let me know if you don't get a call about seeing the skin clinic.   Tetanus shot today.   You can call for a mammogram at the [facility] [address] [place] [address] [place]  Check your CPAP and let me know if it isn't working.    Stretch the arch of your feet before putting any weight on your foot.    Let me know if you want to get set up with the lung cancer screening program.   Try icing the sides of your hips for 5 minutes at a time.

## 2023-07-24 NOTE — Progress Notes (Signed)
 CPE- See plan.  Routine anticipatory guidance given to patient.  See health maintenance.  The possibility exists that previously documented standard health maintenance information may have been brought forward from a previous encounter into this note.  If needed, that same information has been updated to reflect the current situation based on today's encounter.    Tetanus 2025 Flu shot due in the fall.    PNA d/w pt.   Shingles d/w pt.   covid vaccine prev d/w pt.   Tobacco and etoh discussed, precontemplative on both.   Living will, encouraged.  Husband designated if patient were incapacitated.   Mammogram due.  D/w pt.  encouraged.     DXA not due.  Pap 2014.   she'll consider, d/w pt and encouraged.   Colonoscopy 2022  Lung cancer screening d/w pt.    D/w pt about recheck sugar today with A1c.  Labs d/w pt.    3 facial lesions.  L cheek- possible small SK with central irritation.   Chin- Looks like a small SK.  R ear.  Looks like a small SK.   Not feeling well in general, fatigued.  Still on sertraline at baseline.  Snoring, hadn't been using CPAP.  D/w pt about restart.  She has been waking tired.    Still on allopurinol.  No recent gout flares.  No colchicine use.    Hypertension:    Using medication without problems or lightheadedness: yes Chest pain with exertion:no Edema:no Short of breath: some occ need to take a deep breath but not SOB o/w.  Noted in the last few months.  Some rare wheeze.  No sputum, minimal cough.  D/w pt about smoking cessation.    Renal hx d/w pt. she was released by urology.  No gross hematuria.  She has h/o paresthesia in the feet, h/o normal B12.  Gross sensation still intact BLE.  Discussed alcohol and tobacco cessation.  Prev L plantar heel pain, better now. Pain with first step prev, better sitting.    PMH and SH reviewed  Meds, vitals, and allergies reviewed.   ROS: Per HPI.  Unless specifically indicated otherwise in HPI, the patient  denies:  General: fever. Eyes: acute vision changes ENT: sore throat Cardiovascular: chest pain Respiratory: SOB GI: vomiting GU: dysuria Musculoskeletal: acute back pain Derm: acute rash Neuro: acute motor dysfunction Psych: worsening mood Endocrine: polydipsia Heme: bleeding Allergy: hayfever  GEN: nad, alert and oriented HEENT: mucous membranes moist NECK: supple w/o LA CV: rrr. PULM: ctab, no inc wob ABD: soft, +bs EXT: no edema SKIN: no acute rash Head tremor at baseline.  Lipoma R upper back.   B greater trochanter ttp.

## 2023-07-26 DIAGNOSIS — M7060 Trochanteric bursitis, unspecified hip: Secondary | ICD-10-CM | POA: Insufficient documentation

## 2023-07-26 NOTE — Assessment & Plan Note (Signed)
 D/w pt about smoking cessation.  Continue lisinopril and metoprolol.

## 2023-07-26 NOTE — Assessment & Plan Note (Signed)
 Tetanus 2025 Flu shot due in the fall.    PNA d/w pt.   Shingles d/w pt.   covid vaccine prev d/w pt.   Tobacco and etoh discussed, precontemplative on both.   Living will, encouraged.  Husband designated if patient were incapacitated.   Mammogram due.  D/w pt.  encouraged.     DXA not due.  Pap 2014.   she'll consider, d/w pt and encouraged.   Colonoscopy 2022  Lung cancer screening d/w pt.

## 2023-07-26 NOTE — Assessment & Plan Note (Signed)
 Noted at baseline

## 2023-07-26 NOTE — Assessment & Plan Note (Signed)
 Discussed icing and then updating me as needed.

## 2023-07-26 NOTE — Assessment & Plan Note (Signed)
 Renal hx d/w pt. she was released by urology.  No gross hematuria.

## 2023-07-26 NOTE — Assessment & Plan Note (Signed)
 Discussed restarting CPAP.  This could clearly affect her fatigue, etc.  Discussed.

## 2023-07-26 NOTE — Assessment & Plan Note (Signed)
 She has h/o paresthesia in the feet, h/o normal B12.  Gross sensation still intact BLE.  Discussed alcohol and tobacco cessation.

## 2023-07-26 NOTE — Assessment & Plan Note (Signed)
 A1c done at office visit, discussed with patient.

## 2023-07-26 NOTE — Assessment & Plan Note (Signed)
 Still on allopurinol.  No recent gout flares.  No colchicine use.  Continue as is.

## 2023-07-26 NOTE — Assessment & Plan Note (Signed)
 Refer to dermatology

## 2023-07-26 NOTE — Assessment & Plan Note (Signed)
Living will, encouraged. Husband designated if patient were incapacitated.

## 2023-07-26 NOTE — Assessment & Plan Note (Signed)
 Discussed stretching prior to weightbearing.

## 2023-08-07 ENCOUNTER — Other Ambulatory Visit: Payer: Self-pay | Admitting: Family Medicine

## 2023-10-12 ENCOUNTER — Encounter: Payer: Self-pay | Admitting: Physician Assistant

## 2023-10-12 ENCOUNTER — Ambulatory Visit: Admitting: Physician Assistant

## 2023-10-12 VITALS — BP 135/84

## 2023-10-12 DIAGNOSIS — D1801 Hemangioma of skin and subcutaneous tissue: Secondary | ICD-10-CM

## 2023-10-12 DIAGNOSIS — L814 Other melanin hyperpigmentation: Secondary | ICD-10-CM

## 2023-10-12 DIAGNOSIS — B078 Other viral warts: Secondary | ICD-10-CM | POA: Diagnosis not present

## 2023-10-12 DIAGNOSIS — L281 Prurigo nodularis: Secondary | ICD-10-CM | POA: Diagnosis not present

## 2023-10-12 DIAGNOSIS — L578 Other skin changes due to chronic exposure to nonionizing radiation: Secondary | ICD-10-CM

## 2023-10-12 DIAGNOSIS — Z1283 Encounter for screening for malignant neoplasm of skin: Secondary | ICD-10-CM | POA: Diagnosis not present

## 2023-10-12 DIAGNOSIS — D492 Neoplasm of unspecified behavior of bone, soft tissue, and skin: Secondary | ICD-10-CM | POA: Diagnosis not present

## 2023-10-12 DIAGNOSIS — W908XXA Exposure to other nonionizing radiation, initial encounter: Secondary | ICD-10-CM | POA: Diagnosis not present

## 2023-10-12 DIAGNOSIS — L821 Other seborrheic keratosis: Secondary | ICD-10-CM

## 2023-10-12 DIAGNOSIS — D229 Melanocytic nevi, unspecified: Secondary | ICD-10-CM

## 2023-10-12 DIAGNOSIS — D171 Benign lipomatous neoplasm of skin and subcutaneous tissue of trunk: Secondary | ICD-10-CM

## 2023-10-12 DIAGNOSIS — D485 Neoplasm of uncertain behavior of skin: Secondary | ICD-10-CM

## 2023-10-12 NOTE — Patient Instructions (Signed)

## 2023-10-12 NOTE — Progress Notes (Signed)
 New Patient Visit   Subjective  Nichole Burke is a 63 y.o. female who presents for the following: Skin Cancer Screening and Full Body Skin Exam - Patient has a lesion on left cheek and another lesion inside of right ear. Cheek itches and burns sometimes. It has bled on ocassion. It has been there for about a year. Denies hx of skin cancer.   The patient has spots, moles and lesions to be evaluated, some may be new or changing and the patient may have concern these could be cancer.    The following portions of the chart were reviewed this encounter and updated as appropriate: medications, allergies, medical history  Review of Systems:  No other skin or systemic complaints except as noted in HPI or Assessment and Plan.  Objective  Well appearing patient in no apparent distress; mood and affect are within normal limits.  A full examination was performed including scalp, head, eyes, ears, nose, lips, neck, chest, axillae, abdomen, back, buttocks, bilateral upper extremities, bilateral lower extremities, hands, feet, fingers, toes, fingernails, and toenails. All findings within normal limits unless otherwise noted below.   Relevant physical exam findings are noted in the Assessment and Plan.  Left cheek 1.0 cm erythematous hyperkeratotic nodule   Assessment & Plan   SKIN CANCER SCREENING PERFORMED TODAY.  ACTINIC DAMAGE - Chronic condition, secondary to cumulative UV/sun exposure - diffuse scaly erythematous macules with underlying dyspigmentation - Recommend daily broad spectrum sunscreen SPF 30+ to sun-exposed areas, reapply every 2 hours as needed.  - Staying in the shade or wearing long sleeves, sun glasses (UVA+UVB protection) and wide brim hats (4-inch brim around the entire circumference of the hat) are also recommended for sun protection.  - Call for new or changing lesions.  LENTIGINES, SEBORRHEIC KERATOSES, CHERRY ANGIOMAS - Benign normal skin lesions -  Benign-appearing - Call for any changes  MELANOCYTIC NEVI - Tan-brown and/or pink-flesh-colored symmetric macules and papules - Benign appearing on exam today - Observation - Call clinic for new or changing moles - Recommend daily use of broad spectrum spf 30+ sunscreen to sun-exposed areas.   NEOPLASM OF UNCERTAIN BEHAVIOR OF SKIN Left cheek Skin / nail biopsy Type of biopsy: tangential   Informed consent: discussed and consent obtained   Timeout: patient name, date of birth, surgical site, and procedure verified   Procedure prep:  Patient was prepped and draped in usual sterile fashion Prep type:  Isopropyl alcohol Anesthesia: the lesion was anesthetized in a standard fashion   Anesthetic:  1% lidocaine  w/ epinephrine 1-100,000 buffered w/ 8.4% NaHCO3 Instrument used: flexible razor blade   Hemostasis achieved with: pressure, aluminum chloride and electrodesiccation   Outcome: patient tolerated procedure well   Post-procedure details: sterile dressing applied and wound care instructions given   Dressing type: bandage and petrolatum    Specimen 1 - Surgical pathology Differential Diagnosis: Prurigo Nodule vs SCC vs other   Check Margins: No SCREENING EXAM FOR SKIN CANCER   ACTINIC SKIN DAMAGE   LENTIGINES   SEBORRHEIC KERATOSIS   CHERRY ANGIOMA   MULTIPLE BENIGN NEVI   LIPOMA OF TORSO     Lipoma  Exam: Subcutaneous rubbery nodules of back   Benign-appearing. Exam most consistent with a lipoma. Discussed that a lipoma is a benign fatty growth that can grow over time and sometimes get irritated. Recommend observation if it is not bothersome or changing. Discussed option of ILK injections or surgical excision to remove it if it is growing, symptomatic, or  other changes noted. Please call for new or changing lesions so they can be evaluated.  Return if symptoms worsen or fail to improve.  I, Eliot Guernsey, CMA, am acting as scribe for Makaiyah Schweiger K, PA-C  .   Documentation: I have reviewed the above documentation for accuracy and completeness, and I agree with the above.  Galilee Pierron K, PA-C

## 2023-10-12 NOTE — Progress Notes (Deleted)
   New Patient Visit   Subjective  Nichole Burke is a 63 y.o. female who presents for the following: Spot  The patient has spots, moles and lesions to be evaluated, some may be new or changing and the patient may have concern these could be cancer.  Patient has a spot on left cheek and inside of right ear. Cheek itches and burns sometimes. Sometimes clear fluid drains from it. Present for several months. Denies hx of skin cancer    The following portions of the chart were reviewed this encounter and updated as appropriate: medications, allergies, medical history  Review of Systems:  No other skin or systemic complaints except as noted in HPI or Assessment and Plan.  Objective  Well appearing patient in no apparent distress; mood and affect are within normal limits.  A focused examination was performed of the following areas: Left cheek and right ear  Relevant physical exam findings are noted in the Assessment and Plan.    Assessment & Plan      No follow-ups on file.  ***  Documentation: I have reviewed the above documentation for accuracy and completeness, and I agree with the above.  SANDRIDGE,BRENDA K, PA-C

## 2023-10-14 LAB — SURGICAL PATHOLOGY

## 2023-10-15 ENCOUNTER — Ambulatory Visit: Payer: Self-pay | Admitting: Physician Assistant
